# Patient Record
Sex: Female | Born: 1965 | Race: White | Hispanic: No | Marital: Married | Smoking: Former smoker
Health system: Southern US, Community
[De-identification: ages and names within clinical notes are randomized; demographics above are authoritative.]

## PROBLEM LIST (undated history)

## (undated) DIAGNOSIS — R609 Edema, unspecified: Secondary | ICD-10-CM

## (undated) DIAGNOSIS — K8020 Calculus of gallbladder without cholecystitis without obstruction: Secondary | ICD-10-CM

## (undated) DIAGNOSIS — K754 Autoimmune hepatitis: Secondary | ICD-10-CM

## (undated) DIAGNOSIS — R141 Gas pain: Secondary | ICD-10-CM

## (undated) DIAGNOSIS — I059 Rheumatic mitral valve disease, unspecified: Secondary | ICD-10-CM

## (undated) DIAGNOSIS — J019 Acute sinusitis, unspecified: Secondary | ICD-10-CM

## (undated) DIAGNOSIS — R945 Abnormal results of liver function studies: Secondary | ICD-10-CM

## (undated) DIAGNOSIS — E069 Thyroiditis, unspecified: Secondary | ICD-10-CM

## (undated) DIAGNOSIS — I10 Essential (primary) hypertension: Secondary | ICD-10-CM

## (undated) DIAGNOSIS — R142 Eructation: Secondary | ICD-10-CM

## (undated) DIAGNOSIS — E039 Hypothyroidism, unspecified: Secondary | ICD-10-CM

## (undated) DIAGNOSIS — K219 Gastro-esophageal reflux disease without esophagitis: Secondary | ICD-10-CM

## (undated) DIAGNOSIS — R143 Flatulence: Secondary | ICD-10-CM

## (undated) DIAGNOSIS — F419 Anxiety disorder, unspecified: Secondary | ICD-10-CM

## (undated) DIAGNOSIS — I080 Rheumatic disorders of both mitral and aortic valves: Secondary | ICD-10-CM

## (undated) HISTORY — DX: Flatulence: R14.3

## (undated) HISTORY — PX: ABDOMINAL HYSTERECTOMY: SHX81

## (undated) HISTORY — DX: Rheumatic disorders of both mitral and aortic valves: I08.0

## (undated) HISTORY — DX: Gastro-esophageal reflux disease without esophagitis: K21.9

## (undated) HISTORY — DX: Anxiety disorder, unspecified: F41.9

## (undated) HISTORY — DX: Eructation: R14.2

## (undated) HISTORY — PX: CHOLECYSTECTOMY: SHX55

## (undated) HISTORY — PX: MITRAL VALVE SURGERY: SHX714

## (undated) HISTORY — DX: Abnormal results of liver function studies: R94.5

## (undated) HISTORY — DX: Edema, unspecified: R60.9

## (undated) HISTORY — DX: Autoimmune hepatitis: K75.4

## (undated) HISTORY — DX: Thyroiditis, unspecified: E06.9

## (undated) HISTORY — DX: Calculus of gallbladder without cholecystitis without obstruction: K80.20

## (undated) HISTORY — DX: Essential (primary) hypertension: I10

## (undated) HISTORY — DX: Gas pain: R14.1

## (undated) HISTORY — DX: Acute sinusitis, unspecified: J01.90

## (undated) HISTORY — DX: Hypothyroidism, unspecified: E03.9

## (undated) HISTORY — DX: Rheumatic mitral valve disease, unspecified: I05.9

---

## 1999-01-03 ENCOUNTER — Emergency Department (HOSPITAL_COMMUNITY): Admission: EM | Admit: 1999-01-03 | Discharge: 1999-01-03 | Payer: Self-pay | Admitting: Emergency Medicine

## 2008-04-19 ENCOUNTER — Encounter: Payer: Self-pay | Admitting: Gastroenterology

## 2008-04-20 ENCOUNTER — Encounter: Payer: Self-pay | Admitting: Gastroenterology

## 2008-04-28 ENCOUNTER — Encounter: Payer: Self-pay | Admitting: Gastroenterology

## 2008-05-02 ENCOUNTER — Encounter (INDEPENDENT_AMBULATORY_CARE_PROVIDER_SITE_OTHER): Payer: Self-pay | Admitting: *Deleted

## 2008-05-02 ENCOUNTER — Ambulatory Visit: Payer: Self-pay | Admitting: Cardiovascular Disease

## 2008-05-03 DIAGNOSIS — I059 Rheumatic mitral valve disease, unspecified: Secondary | ICD-10-CM

## 2008-05-03 DIAGNOSIS — K754 Autoimmune hepatitis: Secondary | ICD-10-CM

## 2008-05-03 DIAGNOSIS — E559 Vitamin D deficiency, unspecified: Secondary | ICD-10-CM | POA: Insufficient documentation

## 2008-05-03 DIAGNOSIS — I10 Essential (primary) hypertension: Secondary | ICD-10-CM | POA: Insufficient documentation

## 2008-05-05 ENCOUNTER — Ambulatory Visit: Payer: Self-pay | Admitting: Gastroenterology

## 2008-05-05 DIAGNOSIS — R945 Abnormal results of liver function studies: Secondary | ICD-10-CM | POA: Insufficient documentation

## 2008-05-05 LAB — CONVERTED CEMR LAB
ALT: 26 units/L (ref 0–35)
Albumin: 3.6 g/dL (ref 3.5–5.2)
Basophils Absolute: 0 10*3/uL (ref 0.0–0.1)
CO2: 31 meq/L (ref 19–32)
Calcium: 9.4 mg/dL (ref 8.4–10.5)
Chloride: 105 meq/L (ref 96–112)
GFR calc Af Amer: 102 mL/min
GFR calc non Af Amer: 84 mL/min
Glucose, Bld: 85 mg/dL (ref 70–99)
Hemoglobin: 12.2 g/dL (ref 12.0–15.0)
INR: 1.1 — ABNORMAL HIGH (ref 0.8–1.0)
Lymphocytes Relative: 20.6 % (ref 12.0–46.0)
MCHC: 35.4 g/dL (ref 30.0–36.0)
Monocytes Relative: 13.3 % — ABNORMAL HIGH (ref 3.0–12.0)
Neutro Abs: 5.4 10*3/uL (ref 1.4–7.7)
Neutrophils Relative %: 62.2 % (ref 43.0–77.0)
Platelets: 329 10*3/uL (ref 150–400)
Potassium: 3.7 meq/L (ref 3.5–5.1)
Prothrombin Time: 12.1 s (ref 10.9–13.3)
RDW: 11.5 % (ref 11.5–14.6)
Sodium: 141 meq/L (ref 135–145)
Total Protein: 6.5 g/dL (ref 6.0–8.3)

## 2008-05-19 ENCOUNTER — Ambulatory Visit: Payer: Self-pay | Admitting: Cardiovascular Disease

## 2008-05-19 ENCOUNTER — Encounter: Payer: Self-pay | Admitting: Cardiovascular Disease

## 2008-05-19 LAB — CONVERTED CEMR LAB
ALT: 45 units/L — ABNORMAL HIGH (ref 0–35)
Albumin: 3.7 g/dL (ref 3.5–5.2)
BUN: 11 mg/dL (ref 6–23)
Bilirubin, Direct: 0.1 mg/dL (ref 0.0–0.3)
CO2: 32 meq/L (ref 19–32)
Calcium: 9.1 mg/dL (ref 8.4–10.5)
Free T4: 0.6 ng/dL (ref 0.6–1.6)
GFR calc Af Amer: 102 mL/min
Glucose, Bld: 90 mg/dL (ref 70–99)
TSH: 5.74 microintl units/mL — ABNORMAL HIGH (ref 0.35–5.50)
Total Protein: 6.7 g/dL (ref 6.0–8.3)

## 2008-05-30 ENCOUNTER — Ambulatory Visit: Payer: Self-pay | Admitting: Gastroenterology

## 2008-06-13 ENCOUNTER — Encounter: Payer: Self-pay | Admitting: Endocrinology

## 2008-06-24 ENCOUNTER — Ambulatory Visit: Payer: Self-pay | Admitting: Internal Medicine

## 2008-06-24 DIAGNOSIS — R143 Flatulence: Secondary | ICD-10-CM

## 2008-06-24 DIAGNOSIS — R141 Gas pain: Secondary | ICD-10-CM

## 2008-06-24 DIAGNOSIS — R142 Eructation: Secondary | ICD-10-CM

## 2008-06-24 DIAGNOSIS — K219 Gastro-esophageal reflux disease without esophagitis: Secondary | ICD-10-CM

## 2008-08-17 ENCOUNTER — Ambulatory Visit: Payer: Self-pay | Admitting: Internal Medicine

## 2008-08-17 DIAGNOSIS — J019 Acute sinusitis, unspecified: Secondary | ICD-10-CM

## 2008-08-17 LAB — CONVERTED CEMR LAB
Basophils Absolute: 0.2 10*3/uL — ABNORMAL HIGH (ref 0.0–0.1)
Basophils Relative: 2.2 % (ref 0.0–3.0)
Eosinophils Absolute: 0.3 10*3/uL (ref 0.0–0.7)
Hemoglobin: 13.4 g/dL (ref 12.0–15.0)
Lymphocytes Relative: 19.3 % (ref 12.0–46.0)
MCHC: 34.6 g/dL (ref 30.0–36.0)
MCV: 84.3 fL (ref 78.0–100.0)
Monocytes Absolute: 0.4 10*3/uL (ref 0.1–1.0)
Neutro Abs: 5.4 10*3/uL (ref 1.4–7.7)
Neutrophils Relative %: 69.9 % (ref 43.0–77.0)
RBC: 4.57 M/uL (ref 3.87–5.11)
RDW: 12.8 % (ref 11.5–14.6)

## 2008-08-24 ENCOUNTER — Ambulatory Visit: Payer: Self-pay | Admitting: Endocrinology

## 2008-08-24 DIAGNOSIS — E039 Hypothyroidism, unspecified: Secondary | ICD-10-CM | POA: Insufficient documentation

## 2008-08-24 DIAGNOSIS — E069 Thyroiditis, unspecified: Secondary | ICD-10-CM | POA: Insufficient documentation

## 2008-10-10 ENCOUNTER — Ambulatory Visit: Payer: Self-pay | Admitting: Endocrinology

## 2008-10-11 ENCOUNTER — Encounter: Payer: Self-pay | Admitting: Endocrinology

## 2008-10-11 LAB — CONVERTED CEMR LAB: Vit D, 25-Hydroxy: 28 ng/mL — ABNORMAL LOW (ref 30–89)

## 2008-11-11 DIAGNOSIS — R609 Edema, unspecified: Secondary | ICD-10-CM

## 2008-11-11 DIAGNOSIS — I080 Rheumatic disorders of both mitral and aortic valves: Secondary | ICD-10-CM | POA: Insufficient documentation

## 2008-11-15 ENCOUNTER — Ambulatory Visit: Payer: Self-pay | Admitting: Cardiovascular Disease

## 2009-01-09 ENCOUNTER — Ambulatory Visit: Payer: Self-pay

## 2009-01-09 ENCOUNTER — Ambulatory Visit: Payer: Self-pay | Admitting: Cardiovascular Disease

## 2009-01-09 ENCOUNTER — Encounter: Payer: Self-pay | Admitting: Cardiovascular Disease

## 2009-01-10 ENCOUNTER — Ambulatory Visit: Payer: Self-pay | Admitting: Endocrinology

## 2009-01-11 LAB — CONVERTED CEMR LAB
Free T4: 1 ng/dL (ref 0.6–1.6)
T3, Free: 3.1 pg/mL (ref 2.3–4.2)
TSH: 1.86 microintl units/mL (ref 0.35–5.50)

## 2009-01-12 LAB — CONVERTED CEMR LAB: Vit D, 25-Hydroxy: 30 ng/mL (ref 30–89)

## 2009-02-28 ENCOUNTER — Ambulatory Visit: Payer: Self-pay | Admitting: Internal Medicine

## 2009-02-28 DIAGNOSIS — J189 Pneumonia, unspecified organism: Secondary | ICD-10-CM

## 2009-05-17 ENCOUNTER — Encounter (INDEPENDENT_AMBULATORY_CARE_PROVIDER_SITE_OTHER): Payer: Self-pay | Admitting: *Deleted

## 2009-05-31 ENCOUNTER — Ambulatory Visit: Payer: Self-pay | Admitting: Endocrinology

## 2009-05-31 DIAGNOSIS — E8941 Symptomatic postprocedural ovarian failure: Secondary | ICD-10-CM

## 2009-05-31 LAB — CONVERTED CEMR LAB: TSH: 2.25 microintl units/mL (ref 0.35–5.50)

## 2009-06-28 ENCOUNTER — Ambulatory Visit: Payer: Self-pay | Admitting: Cardiovascular Disease

## 2009-08-10 ENCOUNTER — Telehealth (INDEPENDENT_AMBULATORY_CARE_PROVIDER_SITE_OTHER): Payer: Self-pay | Admitting: *Deleted

## 2009-08-14 ENCOUNTER — Encounter: Payer: Self-pay | Admitting: Cardiovascular Disease

## 2009-08-14 ENCOUNTER — Ambulatory Visit: Payer: Self-pay

## 2009-08-14 ENCOUNTER — Encounter (HOSPITAL_COMMUNITY): Admission: RE | Admit: 2009-08-14 | Discharge: 2009-10-24 | Payer: Self-pay | Admitting: Cardiovascular Disease

## 2009-08-14 ENCOUNTER — Ambulatory Visit: Payer: Self-pay | Admitting: Cardiology

## 2009-08-14 ENCOUNTER — Ambulatory Visit (HOSPITAL_COMMUNITY): Admission: RE | Admit: 2009-08-14 | Discharge: 2009-08-14 | Payer: Self-pay | Admitting: Cardiovascular Disease

## 2010-01-23 ENCOUNTER — Ambulatory Visit: Payer: Self-pay | Admitting: Internal Medicine

## 2010-01-23 ENCOUNTER — Encounter: Payer: Self-pay | Admitting: Internal Medicine

## 2010-01-23 DIAGNOSIS — J45901 Unspecified asthma with (acute) exacerbation: Secondary | ICD-10-CM | POA: Insufficient documentation

## 2010-01-23 DIAGNOSIS — J3089 Other allergic rhinitis: Secondary | ICD-10-CM

## 2010-01-25 ENCOUNTER — Encounter: Payer: Self-pay | Admitting: Internal Medicine

## 2010-03-16 IMAGING — CR DG CHEST 2V
2 series · 2 of 2 positions shown · non-contrast
Comparison: None

CLINICAL DATA: Posterior right chest pain, cough, hypertension,
smoker, right lower lobe pleurisy

CHEST - 2 VIEW

[view not recorded (1 of 2)]
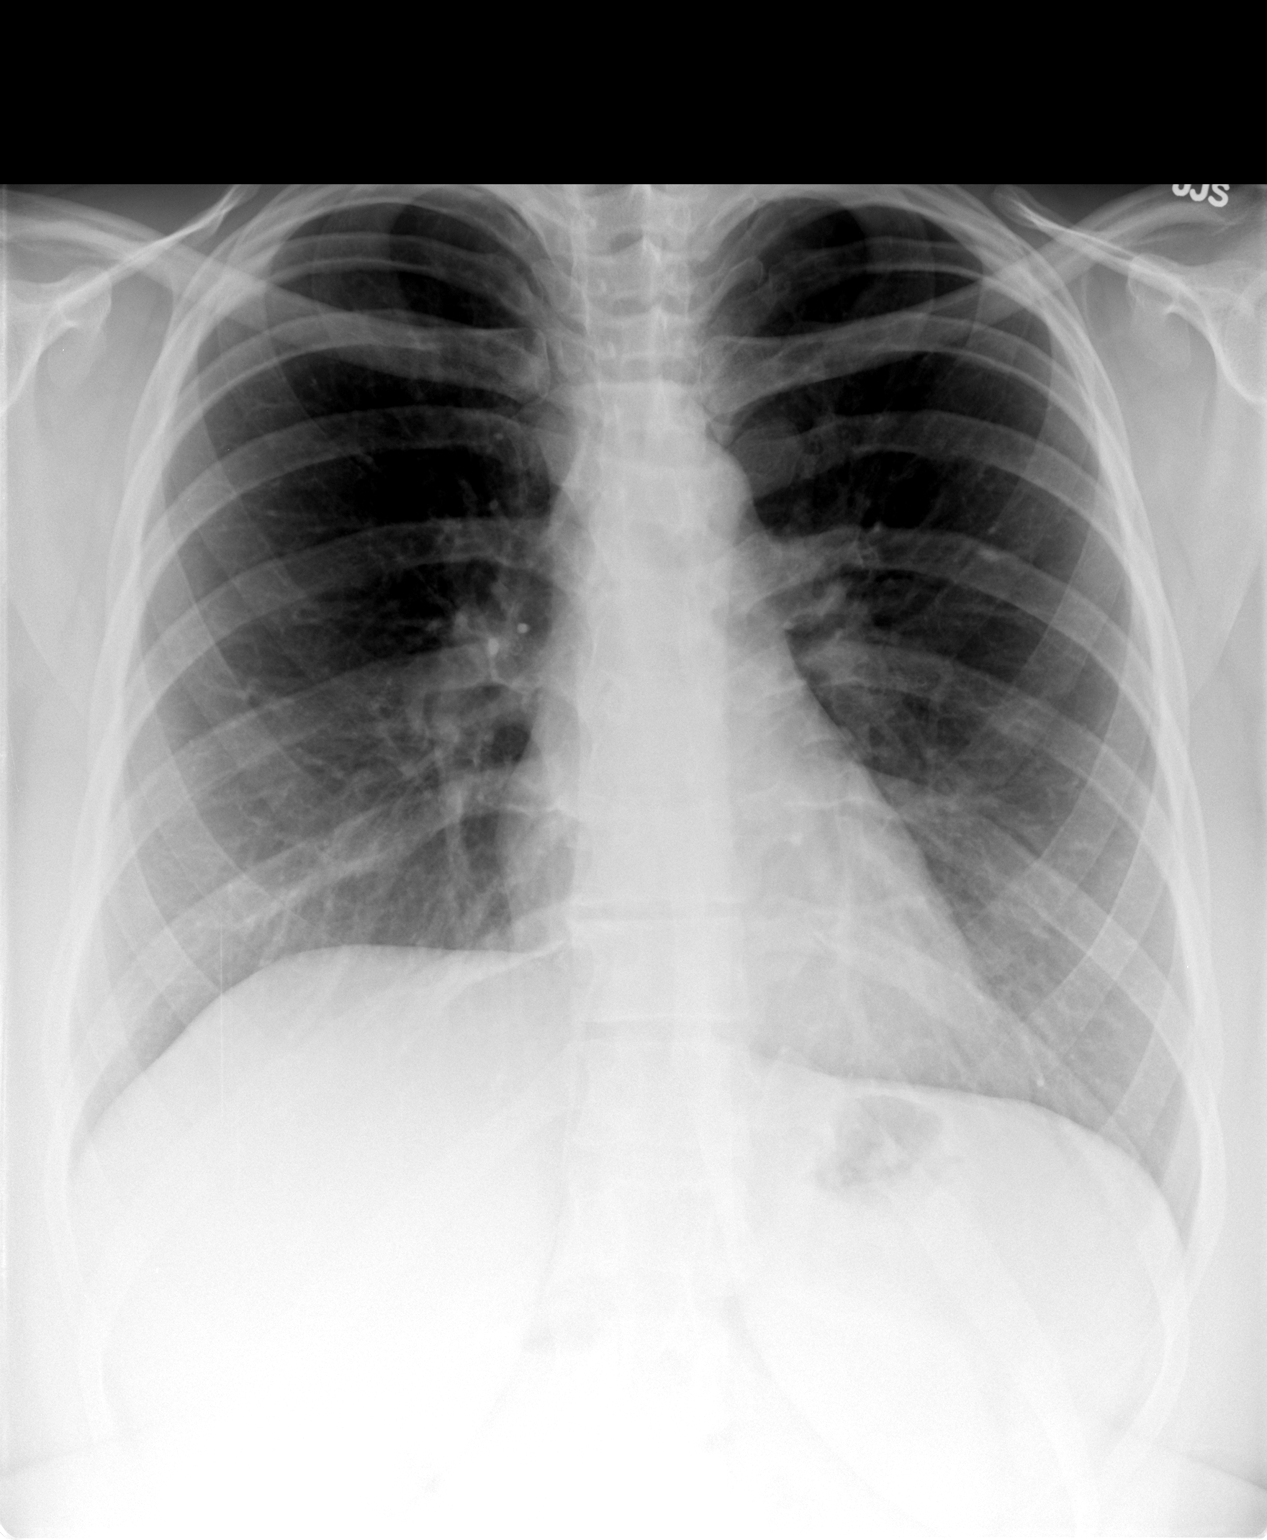

[view not recorded (2 of 2)]
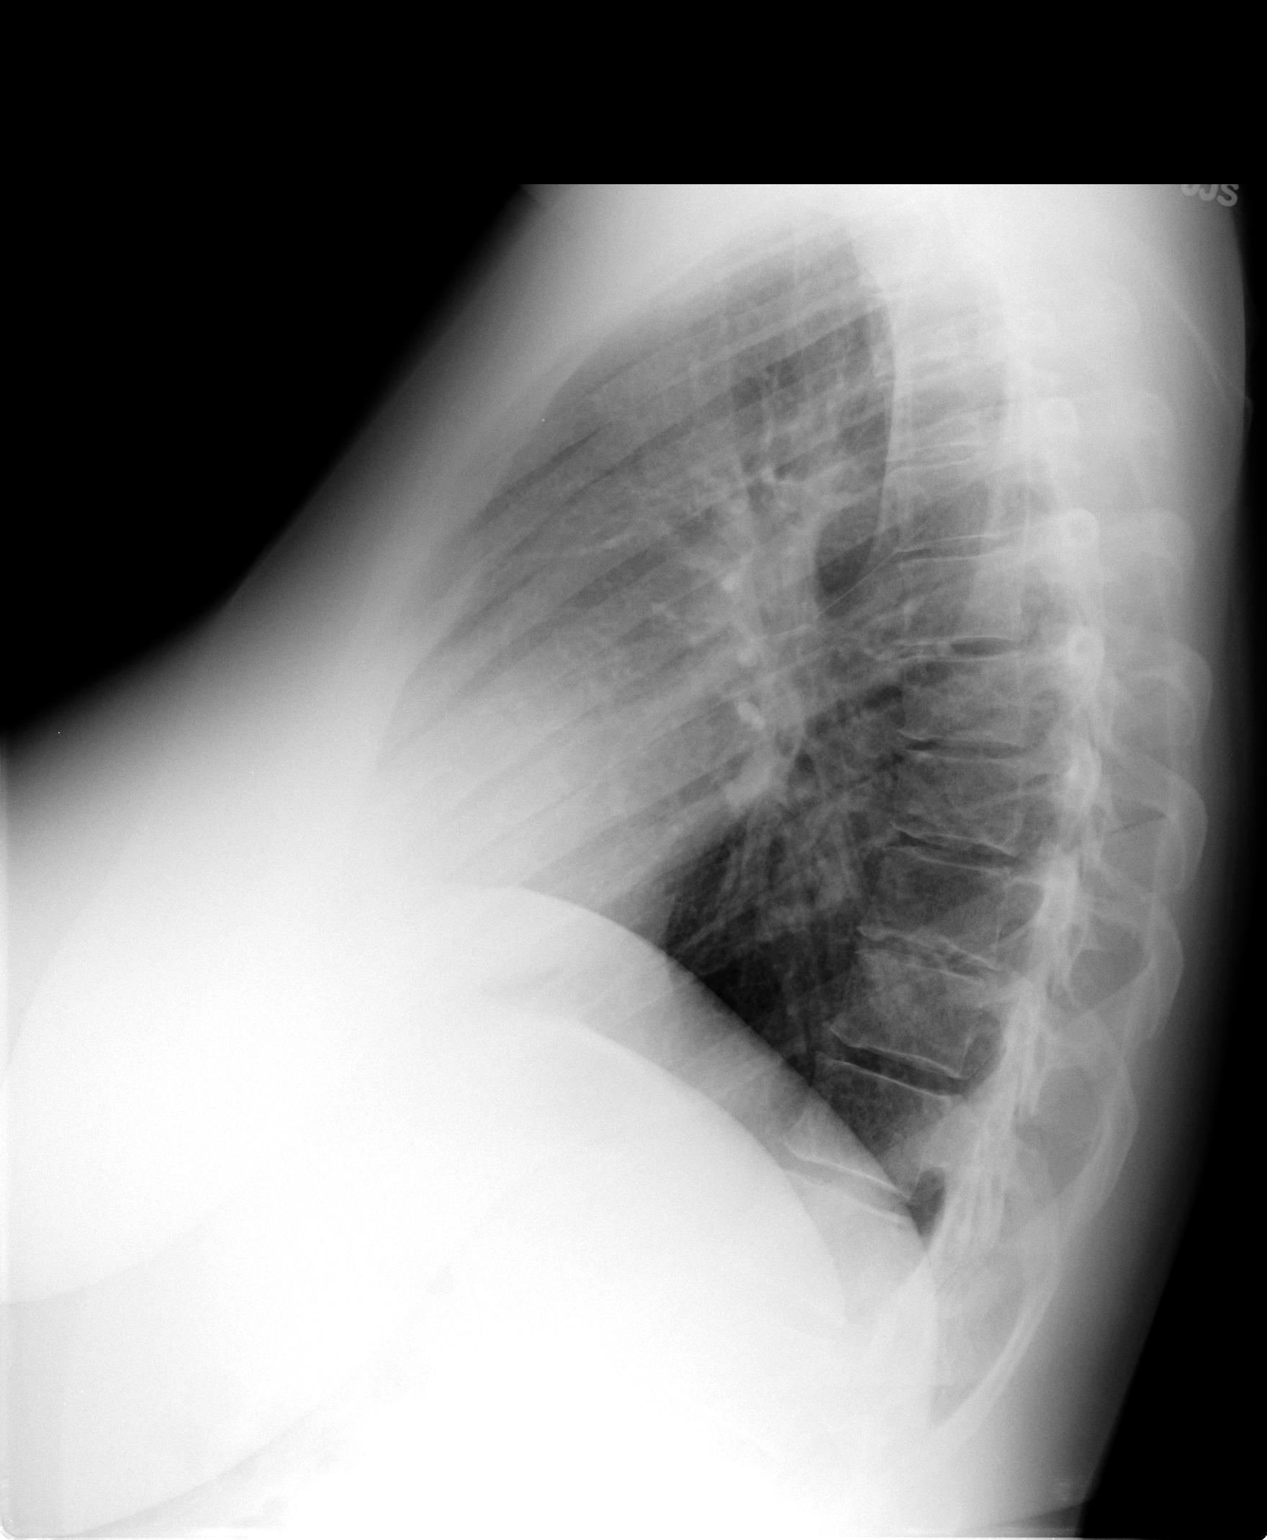

[2 of 2 positions shown; findings below may reference images not displayed]

FINDINGS: Normal heart size, mediastinal contours, and pulmonary vascularity.
Lungs clear.
Bones unremarkable.
No pneumothorax.
IMPRESSION: No acute abnormalities.

## 2010-04-09 ENCOUNTER — Ambulatory Visit: Payer: Self-pay | Admitting: Internal Medicine

## 2010-05-22 NOTE — Assessment & Plan Note (Signed)
Summary: F6M/DM   Referring Provider:  Eden Emms Primary Provider:  Etta Grandchild MD  CC:  pt compalins of chest pain which radiates to her left arm pt thinks its due to stress mtoher passed away.  History of Present Illness: Marilyn Dougherty is seen today for F/U of significant MR and posterior leaflet prolpase.  I reviewed her echo from today.  She had restricted motion of the posteriror leaflet but the prolapse was difficult to visualize.  I thought her MR was more moderate in nature. with normal LV cavity size and funciton.  She is asymptomatic with no dyspnea, PND, orthopnea edema or palpitaitons.  She had some SSCP last week while at [facility].  Her mother in law was dying from pneumonia.  The pain was left center inthe chest "squeezing" with intermitant nature.  She had recurrences for 2-3 days but is improved now.  She has no documented CAD but has not had a recent ETT.  He ECG has non-specific lateeral T wave changes and I think she will need a myovue.  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 1/2 Tablet Two Times A Day 2)  Levothyroxine Sodium 25 Mcg Tabs (Levothyroxine Sodium) .Marland Kitchen.. 1 Qd  Allergies (verified): 1)  ! * Z Pack  Past History:  Past Medical History: Last updated: 11/11/2008 Current Problems:    HYPERTENSION (ICD-401.9) MITRAL VALVE PROLAPSE (ICD-424.0) MITRAL INSUFFICIENCY (ICD-396.3) THYROIDITIS (ICD-245.9) SINUSITIS- ACUTE-NOS (ICD-461.9) GERD (ICD-530.81) GAS/BLOATING (ICD-787.3) NONSPECIFIC ABNORMAL RESULTS LIVR FUNCTION STUDY (ICD-794.8) UNSPECIFIED VITAMIN D DEFICIENCY (ICD-268.9) AUTOIMMUNE HEPATITIS (ICD-571.42) EDEMA (ICD-782.3) HYPOTHYROIDISM (ICD-244.9) Anxiety Disorder Gallstones  Past Surgical History: Last updated: 11/11/2008 Cholecystectomy Hysterectomy  mitral valve surgery in [place].  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  Alcohol Use - no Daily Caffeine Use Illicit Drug Use - no Patient does not get regular exercise.   Review of Systems       Denies fever, malais, weight loss, blurry vision, decreased visual acuity, cough, sputum, SOB, hemoptysis, pleuritic pain, palpitaitons, heartburn, abdominal pain, melena, lower extremity edema, claudication, or rash.   Vital Signs:  Patient profile:   45 year old female Height:      67 inches Weight:      229 pounds BMI:     36.00 Pulse rate:   69 / minute Resp:     12 per minute BP sitting:   119 / 69  (left arm)  Vitals Entered By: Kem Parkinson (June 28, 2009 10:18 AM)  Physical Exam  General:  Affect appropriate Healthy:  appears  stated age HEENT: normal Neck supple with no adenopathy JVP normal no bruits no thyromegaly Lungs clear with no wheezing and good diaphragmatic motion Heart:  S1/S2 no murmur,rub, gallop or click PMI normal Abdomen: benighn, BS positve, no tenderness, no AAA no bruit.  No  HSM or HJR Distal pulses intact with no bruits No edema Neuro non-focal Skin warm and dry    Impression & Recommendations:  Problem # 1:  CHEST PAIN (ICD-786.50) Continue ASA and BB  Myouve Her updated medication list for this problem includes:    Atenolol 25 Mg Tabs (Atenolol) .Marland Kitchen... 1/2 tablet two times a day  Orders: Nuclear Stress Test (Nuc Stress Test)  Problem # 2:  HYPERTENSION (ICD-401.9) Well cotnrolled Her updated medication list for this problem includes:    Atenolol 25 Mg Tabs (Atenolol) .Marland Kitchen... 1/2 tablet two times a day  Problem # 3:  MITRAL VALVE PROLAPSE (ICD-424.0) Wtih moderate MR  Echo to reassess severity and check ESV and EF Her updated medication list for this problem includes:    Atenolol 25 Mg Tabs (Atenolol) .Marland Kitchen... 1/2 tablet two times a day  Problem # 4:  THYROIDITIS (ICD-245.9) Normla TSH/  F/U Dr Everardo All  Other Orders: Echocardiogram (Echo)  Patient Instructions: 1)  Your physician recommends that you schedule a follow-up appointment in: 6 MONTHS 2)  Your physician has requested that you have an echocardiogram.  Echocardiography is a painless test that uses sound waves to create images of your heart. It provides your doctor with information about the size and shape of your heart and how well your heart's chambers and valves are working.  This procedure takes approximately one hour. There are no restrictions for this procedure. 3)  Your physician has requested that you have an exercise stress myoview.  For further information please visit [email or web address]  Please follow instruction sheet, as given.   EKG Report  Procedure date:  06/28/2009  Findings:      NSR 70 Nonspecifc lateral T wave changes LAE Borderline ECG

## 2010-05-22 NOTE — Assessment & Plan Note (Signed)
Summary: f/u appt/#/cd   Vital Signs:  Patient profile:   45 year old female Height:      67 inches (170.18 cm) Weight:      227 pounds (103.18 kg) O2 Sat:      99 % on Room air Temp:     96.8 degrees F (36 degrees C) oral Pulse rate:   66 / minute BP sitting:   110 / 72  (left arm) Cuff size:   large  Vitals Entered By: Josph Macho CMA (May 31, 2009 10:49 AM)  O2 Flow:  Room air CC: follow-up visit/ pt states she is no longer taking Calcium/ CF   Referring Provider:  Eden Emms Primary Provider:  Etta Grandchild MD  CC:  follow-up visit/ pt states she is no longer taking Calcium/ CF.  History of Present Illness: pt has h/o subacute thyroiditis. during the hypothyroid phase, she was started on synthroid 25 micrograms/day, and she is still on that now.  she forgets synthroid approx 1/week.  pt states she feels well in general, except for "hot flashes."  she says she does not have heat intolerance in general.  she had tah 2 years ago, but she did not have bso.    Current Medications (verified): 1)  Atenolol 25 Mg Tabs (Atenolol) .... 1/2 Tablet Two Times A Day 2)  Calcium 500 Mg Tabs (Calcium Carbonate) .Marland Kitchen.. 1 Tab By Mouth Once Daily 3)  Levothyroxine Sodium 25 Mcg Tabs (Levothyroxine Sodium) .Marland Kitchen.. 1 Qd  Allergies (verified): 1)  ! * Z Pack  Past History:  Past Medical History: Last updated: 11/11/2008 Current Problems:  HYPERTENSION (ICD-401.9) MITRAL VALVE PROLAPSE (ICD-424.0) MITRAL INSUFFICIENCY (ICD-396.3) THYROIDITIS (ICD-245.9) SINUSITIS- ACUTE-NOS (ICD-461.9) GERD (ICD-530.81) GAS/BLOATING (ICD-787.3) NONSPECIFIC ABNORMAL RESULTS LIVR FUNCTION STUDY (ICD-794.8) UNSPECIFIED VITAMIN D DEFICIENCY (ICD-268.9) AUTOIMMUNE HEPATITIS (ICD-571.42) EDEMA (ICD-782.3) HYPOTHYROIDISM (ICD-244.9) Anxiety Disorder Gallstones  Review of Systems  The patient denies weight loss and weight gain.    Physical Exam  General:  obese.  no distress  Neck:  thyroid is  not enlarged Additional Exam:  FastTSH                   2.25 uIU/mL                 0.35-5.50 FSH                       5.8 mIU/ML Leutinizing Hormone       4.03 mIU/mL   Impression & Recommendations:  Problem # 1:  THYROIDITIS (ICD-245.9) euthyroid on a modest replacement dosage  Problem # 2:  "hot flashes" these gonadotropins do not indicate menopause  Other Orders: TLB-TSH (Thyroid Stimulating Hormone) (84443-TSH) TLB-FSH (Follicle Stimulating Hormone) (83001-FSH) TLB-Luteinizing Hormone (LH) (83002-LH) Est. Patient Level III (24401)  Patient Instructions: 1)  pending the test results, please continue the same medications for now 2)  return here as needed.  at least each year, you should have a physical examination of the thyroid, and a thyroid blood test.  3)  tests are being ordered for you today.  a few days after the test(s), please call [phone] to hear your test results. 4)  (update: i left message on phone-tree:  rx as we discussed)

## 2010-05-22 NOTE — Letter (Signed)
Summary: Appointment - Reminder 2  [facility], Main Office  [address]   [place], [place] [address]   Phone: [phone]  Fax: [phone]     May 17, 2009 MRN: [id number]   [facility] [address] [place], [place]  [address]   Dear Ms. Marilyn Dougherty,  Our records indicate that it is time to schedule a follow-up appointment with Dr. Eden Emms. It is very important that we reach you to schedule this appointment. We look forward to participating in your health care needs. Please contact us at the number listed above at your earliest convenience to schedule your appointment.  If you are unable to make an appointment at this time, give [place] a call so we can update our records.  Sincerely,   Migdalia Dk [facility] Scheduling Team

## 2010-05-22 NOTE — Progress Notes (Signed)
Summary: Nuclear Pre-Procedure  Phone Note Outgoing Call   Call placed by: Milana Na, EMT-P,  August 10, 2009 11:02 AM Summary of Call: Reviewed information on Myoview Information Sheet (see scanned document for further details).  Spoke with patient.     Nuclear Med Background Indications for Stress Test: Evaluation for Ischemia   History: Echo  History Comments: 09/10 ECHO EF 55-65% Mod. MR  Symptoms: Chest Pain    Nuclear Pre-Procedure Cardiac Risk Factors: Family History - CAD, History of Smoking, Hypertension Height (in): 67  Nuclear Med Study Referring MD:  P.Sara Lee

## 2010-05-22 NOTE — Assessment & Plan Note (Signed)
Summary: Cardiology Nuclear Study  Nuclear Med Background Indications for Stress Test: Evaluation for Ischemia   History: Echo  History Comments: 09/10 ECHO EF 55-65% Mod. MR  Symptoms: Chest Pain, DOE, Fatigue, Palpitations    Nuclear Pre-Procedure Cardiac Risk Factors: Family History - CAD, History of Smoking, Hypertension, Smoker Caffeine/Decaff Intake: None NPO After: 8:00 PM Lungs: clear IV 0.9% NS with Angio Cath: 22g     IV Site: (R) Hand IV Started by: Irean Hong RN Chest Size (in) 42     Cup Size DDD     Height (in): 68 Weight (lb): 225 BMI: 34.33 Tech Comments: Atenolol 8 am today per MD orders.  Nuclear Med Study 1 or 2 day study:  1 day     Stress Test Type:  Stress Reading MD:  Olga Millers, MD     Referring MD:  P.Nishan Resting Radionuclide:  Technetium 75m Tetrofosmin     Resting Radionuclide Dose:  11.0 mCi  Stress Radionuclide:  Technetium 68m Tetrofosmin     Stress Radionuclide Dose:  33.0 mCi   Stress Protocol Exercise Time (min):  10:00 min     Max HR:  160 bpm     Predicted Max HR:  179 bpm  Max Systolic BP: 161 mm Hg     Percent Max HR:  89.39 %     METS: 10.50 Rate Pressure Product:  82956    Stress Test Technologist:  Frederick Peers EMT-P     Nuclear Technologist:  Domenic Polite CNMT  Rest Procedure  Myocardial perfusion imaging was performed at rest 45 minutes following the intravenous administration of Myoview Technetium 61m Tetrofosmin.  Stress Procedure  The patient exercised for 10:00 mins . The patient stopped due to leg fatigue/SOB  and denied any chest pain.  There were non specific ST-T wave changes.  Myoview was injected at peak exercise and myocardial perfusion imaging was performed after a brief delay.  QPS Raw Data Images:  There is a breast shadow that accounts for the anterior attenuation. Stress Images:  There is decreased uptake in the distal anterior wall and apex. Rest Images:  There is decreased uptake in the distal  anterior wall and apex, slightly less prominent compared to the stress images. Subtraction (SDS):  There is a fixed anterior defect that is most consistent with breast attenuation. Transient Ischemic Dilatation:  1.13  (Normal <1.22)  Lung/Heart Ratio:  .33  (Normal <0.45)  Quantitative Gated Spect Images QGS EDV:  97 ml QGS ESV:  26 ml QGS EF:  73 % QGS cine images:  Normal wall motion.   Overall Impression  Exercise Capacity: Good exercise capacity. BP Response: Normal blood pressure response. Clinical Symptoms: There is dyspnea. ECG Impression: No significant ST segment change suggestive of ischemia. Overall Impression: There is breast attenuation but no sign of scar or ischemia.  Appended Document: Cardiology Nuclear Study normal nuclear study  Appended Document: Cardiology Nuclear Study pt aware of results

## 2010-05-22 NOTE — Letter (Signed)
Summary: Out of Work  [facility]  [address] [place], [place] [address]   Phone: [phone]  Fax: [phone]    January 23, 2010   Employee:  Marilyn Dougherty    To Whom It May Concern:   For Medical reasons, please excuse the above named employee from work for the following dates:  Start:   01/23/2010  End:   01/25/2010  If you need additional information, please feel free to contact our office.         Sincerely,    Etta Grandchild MD

## 2010-05-22 NOTE — Letter (Signed)
Summary: Out of Work  [facility]  [address] [place], [place] [address]   Phone: [phone]  Fax: [phone]    January 25, 2010   Employee:  ADALEE KATHAN    To Whom It May Concern:   For Medical reasons, please excuse the above named employee from work for the following dates:  Start:   01/25/2010  End:   01/29/2010  If you need additional information, please feel free to contact our office.         Sincerely,    Alvy Beal A CMA for Dr. Sanda Linger  Appended Document: Out of Work Patient walked in 01/25/10 stating that she returned to work this am, only to be sent back home due to continued illness. Per MD, ok to provide out of work note until 01/29/10. Patient given note and told that we will call her once FMLA forms have been completed. [place]/

## 2010-05-22 NOTE — Assessment & Plan Note (Signed)
Summary: ?SEVERE SINUS INFECTION-LB   Vital Signs:  Patient profile:   45 year old female Menstrual status:  hysterectomy Height:      68 inches Weight:      230 pounds O2 Sat:      98 % on Room air Temp:     98.1 degrees F oral Pulse rate:   80 / minute Pulse rhythm:   regular Resp:     16 per minute BP sitting:   134 / 80  (left arm)  O2 Flow:  Room air  Primary Care Provider:  Etta Grandchild MD  CC:  URI symptoms.  History of Present Illness:  URI Symptoms      This is a 45 year old woman who presents with URI symptoms.  The symptoms began 2 weeks ago.  The severity is described as mild.  The patient reports nasal congestion, sore throat, productive cough, and sick contacts, but denies clear nasal discharge, purulent nasal discharge, and earache.  Associated symptoms include wheezing.  The patient denies fever, fever of 103.1-104 degrees, stiff neck, dyspnea, rash, vomiting, diarrhea, use of an antipyretic, and response to antipyretic.  The patient also reports itchy throat, sneezing, and seasonal symptoms.  The patient denies headache, muscle aches, and severe fatigue.  Risk factors for Strep sinusitis include unilateral facial pain, unilateral nasal discharge, poor response to decongestant, and double sickening.  The patient denies the following risk factors for Strep sinusitis: tooth pain, Strep exposure, tender adenopathy, and absence of cough.    Allergies: 1)  ! * Z Pack  Past History:  Past Medical History: Last updated: 11/11/2008 Current Problems:  HYPERTENSION (ICD-401.9) MITRAL VALVE PROLAPSE (ICD-424.0) MITRAL INSUFFICIENCY (ICD-396.3) THYROIDITIS (ICD-245.9) SINUSITIS- ACUTE-NOS (ICD-461.9) GERD (ICD-530.81) GAS/BLOATING (ICD-787.3) NONSPECIFIC ABNORMAL RESULTS LIVR FUNCTION STUDY (ICD-794.8) UNSPECIFIED VITAMIN D DEFICIENCY (ICD-268.9) AUTOIMMUNE HEPATITIS (ICD-571.42) EDEMA (ICD-782.3) HYPOTHYROIDISM (ICD-244.9) Anxiety Disorder Gallstones  Past  Surgical History: Last updated: 11/11/2008 Cholecystectomy Hysterectomy  mitral valve surgery in [place].  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  Alcohol Use - no Daily Caffeine Use Illicit Drug Use - no Patient does not get regular exercise.   Risk Factors: Exercise: no (05/05/2008)  Risk Factors: Smoking Status: quit (05/05/2008)  Family History: Reviewed history from 08/24/2008 and no changes required. Family History of Colitis/Crohn's: Paternal Cousin x 3 Family History of Heart Disease: Paternal Grandmother Family History of Liver Disease/Cirrhosis:Mother Family History Lung Cancer: Father (smoker) mother (deceased) and a sister have uncertain type of thyroid dz  Social History: Reviewed history from 05/05/2008 and no changes required. 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  Alcohol Use - no Daily Caffeine Use Illicit Drug Use - no Patient does not get regular exercise.   Review of Systems  The patient denies anorexia, fever, decreased hearing, hoarseness, chest pain, peripheral edema, headaches, hemoptysis, abdominal pain, suspicious skin lesions, and enlarged lymph nodes.    Physical Exam  General:  overweight-appearing.  alert, well-developed, well-nourished, and cooperative to examination.    Ears:  R ear normal and L ear normal.   Nose:  no external deformity, no airflow obstruction, no intranasal foreign body, no nasal polyps, no nasal mucosal  lesions, no mucosal friability, no active bleeding or clots, no sinus percussion tenderness, no septum  abnormalities, nasal dischargemucosal pallor, and mucosal edema.   Mouth:  Oral mucosa and oropharynx without lesions or exudates.  Teeth in good repair. Neck:  supple, full ROM, no masses, no thyromegaly, no JVD, normal carotid upstroke, no carotid bruits, no cervical lymphadenopathy, and no neck tenderness.   Lungs:  normal respiratory effort, no intercostal retractions, no accessory muscle use, normal breath sounds, no dullness, no fremitus, no crackles, R  faint inspri wheezes, and L faint inspir wheezes.  she has good air movement. Heart:  normal rate, regular rhythm, no murmur, no gallop, no rub, and no JVD.   Abdomen:  soft, non-tender, normal bowel sounds, no distention, no masses, no guarding, no rigidity, no rebound tenderness, no abdominal hernia, no inguinal hernia, no hepatomegaly, and no splenomegaly.   Msk:  No deformity or scoliosis noted of thoracic or lumbar spine.   Pulses:  R and L carotid,radial,femoral,dorsalis pedis and posterior tibial pulses are full and equal bilaterally Extremities:  No clubbing, cyanosis, edema, or deformity noted with normal full range of motion of all joints.   Neurologic:  No cranial nerve deficits noted. Station and gait are normal. Plantar reflexes are down-going bilaterally. DTRs are symmetrical throughout. Sensory, motor and coordinative functions appear intact. Skin:  Intact without suspicious lesions or rashes Cervical Nodes:  No lymphadenopathy noted Axillary Nodes:  No palpable lymphadenopathy Psych:  Cognition and judgment appear intact. Alert and cooperative with normal attention span and concentration. No apparent delusions, illusions, hallucinations   Impression & Recommendations:  Problem # 1:  ASTHMA NOS W/ACUTE EXACERBATION (ICD-493.92) will give depo-medrol IM Her updated medication list for this problem includes:    Dulera 100-5 Mcg/act Aero (Mometasone furo-formoterol fum) .Marland Kitchen... 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 One by mouth two times a day for 10 days    Mytussin Ac 100-10 Mg/46ml Syrp (Guaifenesin-codeine) .Marland Kitchen... 5-10 ml by mouth qid as needed for cough  Problem # 5:  HYPERTENSION (ICD-401.9) Assessment: Improved  Her updated medication list for this problem includes:    Atenolol 25 Mg Tabs (Atenolol) .Marland Kitchen... 1/2 tablet two times a day  BP today: 134/80 Prior BP: 119/69 (06/28/2009)  Prior 10 Yr Risk Heart Disease: Not enough information (06/24/2008)  Labs Reviewed: K+: 3.9 (05/19/2008) Creat: : 0.8 (05/19/2008)     Complete Medication List: 1)  Atenolol 25 Mg Tabs (Atenolol) .... 1/2 tablet two times a day 2)  Levothyroxine Sodium 25 Mcg Tabs (Levothyroxine sodium) .Marland Kitchen.. 1 qd 3)  Dulera 100-5 Mcg/act Aero (Mometasone furo-formoterol fum) .... 2 puffs two times a day for asthma 4)  Cefuroxime Axetil 500 Mg Tabs (Cefuroxime axetil) .... One by mouth two times a day for 10 days 5)  Mytussin Ac 100-10 Mg/63ml Syrp (Guaifenesin-codeine) .... 5-10 ml by mouth qid as needed for cough  Patient Instructions: 1)  Please schedule a follow-up appointment in 2 weeks. 2)  It is important that you exercise  regularly at least 20 minutes 5 times a week. If you develop chest pain, have severe difficulty breathing, or feel very tired , stop exercising immediately and seek medical attention. 3)  You need to lose weight. Consider a lower calorie diet and regular exercise.  4)  It is important to use your inhaler properly. Use a  spacer, take slow deep breaths and hold them. Rinse your mouth after using.  5)  Take your antibiotic as prescribed until ALL of it is gone, but stop if you develop a rash or swelling and contact our office as soon as possible. 6)  Acute sinusitis symptoms for less than 10 days are not helped by antibiotics.Use warm moist compresses, and over the counter decongestants ( only as directed). Call if no improvement in 5-7 days, sooner if increasing pain, fever, or new symptoms. Prescriptions: MYTUSSIN AC 100-10 MG/5ML SYRP (GUAIFENESIN-CODEINE) 5-10 ml by mouth QID as needed for cough  #8 ounces x 0   Entered and Authorized by:   Etta Grandchild MD   Signed by:   Etta Grandchild MD on 01/23/2010   Method used:   Print then Give to Patient   RxID:   [id number] CEFUROXIME AXETIL 500 MG TABS (CEFUROXIME AXETIL) One by mouth two times a day for 10 days  #20 x 1   Entered and Authorized by:   Etta Grandchild MD   Signed by:   Etta Grandchild MD on 01/23/2010   Method used:   Print then Give to Patient   RxID:   [id number] DULERA 100-5 MCG/ACT AERO (MOMETASONE FURO-FORMOTEROL FUM) 2 puffs two times a day for asthma  #4 inhs x 0   Entered and Authorized by:   Etta Grandchild MD   Signed by:   Etta Grandchild MD on 01/23/2010   Method used:   Samples Given   RxID:   [id number]    Medication Administration  Injection # 1:    Medication: Depo- Medrol 80mg     Diagnosis: ALLERGIC RHINITIS DUE TO OTHER ALLERGEN (ICD-477.8)    Route: IV    Site: R deltoid    Exp Date: 07/2012    Lot #: [id number]    Mfr: pfizer    Patient tolerated injection without complications    Given by: Rock Nephew CMA (January 23, 2010 9:34 AM)  Injection # 2:    Medication: Depo- Medrol 40mg     Diagnosis: ALLERGIC RHINITIS DUE TO OTHER ALLERGEN (ICD-477.8)    Route: IM    Site: R deltoid    Exp Date: 07/2012    Lot #: [id number]    Mfr: pfizer    Patient tolerated injection without complications    Given  by: Rock Nephew CMA (January 23, 2010 9:35 AM)  Orders Added: 1)  T-2 View CXR [71020TC] 2)  Admin of Therapeutic Inj  intramuscular or subcutaneous [96372] 3)  Depo- Medrol 40mg  [J1030] 4)  Depo- Medrol 80mg  [J1040] 5)  Est. Patient Level V [29518]

## 2010-05-24 NOTE — Assessment & Plan Note (Signed)
Summary: headache/fever/sore throat/will come to side door/cd   Vital Signs:  Patient profile:   45 year old female Menstrual status:  hysterectomy Height:      68 inches Weight:      225.25 pounds BMI:     34.37 O2 Sat:      97 % on Room air Temp:     97.9 degrees F oral Pulse rate:   91 / minute Pulse rhythm:   regular Resp:     16 per minute BP sitting:   106 / 64  (left arm) Cuff size:   large  Vitals Entered By: Rock Nephew CMA (April 09, 2010 10:30 AM)  Nutrition Counseling: Patient's BMI is greater than 25 and therefore counseled on weight management options.  O2 Flow:  Room air CC: Patient c/o fever, cough congestion , URI symptoms Is Patient Diabetic? No Pain Assessment Patient in pain? no        Primary Care Provider:  Etta Grandchild MD  CC:  Patient c/o fever, cough congestion , and URI symptoms.  History of Present Illness:  URI Symptoms      This is a 45 year old woman who presents with URI symptoms.  The symptoms began 3 days ago.  The severity is described as mild.  The patient reports nasal congestion, purulent nasal discharge, sore throat, and productive cough, but denies dry cough, earache, and sick contacts.  Associated symptoms include low-grade fever (<100.5 degrees).  The patient denies stiff neck, dyspnea, wheezing, rash, vomiting, diarrhea, use of an antipyretic, and response to antipyretic.  The patient denies itchy throat, sneezing, seasonal symptoms, headache, muscle aches, and severe fatigue.  Risk factors for Strep sinusitis include unilateral facial pain, unilateral nasal discharge, poor response to decongestant, and double sickening.  The patient denies the following risk factors for Strep sinusitis: tooth pain, Strep exposure, tender adenopathy, and absence of cough.    Preventive Screening-Counseling & Management  Alcohol-Tobacco     Alcohol drinks/day: 0     Alcohol Counseling: not indicated; patient does not drink     Smoking  Status: quit     Tobacco Counseling: to remain off tobacco products  Hep-HIV-STD-Contraception     Hepatitis Risk: no risk noted     HIV Risk: no risk noted     STD Risk: no risk noted      Drug Use:  no.    Medications Prior to Update: 1)  Atenolol 25 Mg Tabs (Atenolol) .... 1/2 Tablet Two Times A Day 2)  Levothyroxine Sodium 25 Mcg Tabs (Levothyroxine Sodium) .Marland Kitchen.. 1 Qd 3)  Dulera 100-5 Mcg/act Aero (Mometasone Furo-Formoterol Fum) .... 2 Puffs Two Times A Day For Asthma 4)  Mytussin Ac 100-10 Mg/49ml Syrp (Guaifenesin-Codeine) .... 5-10 Ml By Mouth Qid As Needed For Cough  Current Medications (verified): 1)  Atenolol 25 Mg Tabs (Atenolol) .... 1/2 Tablet Two Times A Day 2)  Levothyroxine Sodium 25 Mcg Tabs (Levothyroxine Sodium) .Marland Kitchen.. 1 Qd 3)  Dulera 100-5 Mcg/act Aero (Mometasone Furo-Formoterol Fum) .... 2 Puffs Two Times A Day For Asthma 4)  Ceftin 500 Mg Tab (Cefuroxime Axetil) .... Take One (1) Tablet By Mouth Two (2) Times A Day X 10 Days  Allergies (verified): 1)  ! * Z Pack  Past History:  Past Medical History: Last updated: 11/11/2008 Current Problems:  HYPERTENSION (ICD-401.9) MITRAL VALVE PROLAPSE (ICD-424.0) MITRAL INSUFFICIENCY (ICD-396.3) THYROIDITIS (ICD-245.9) SINUSITIS- ACUTE-NOS (ICD-461.9) GERD (ICD-530.81) GAS/BLOATING (ICD-787.3) NONSPECIFIC ABNORMAL RESULTS LIVR FUNCTION STUDY (ICD-794.8) UNSPECIFIED VITAMIN  D DEFICIENCY (ICD-268.9) AUTOIMMUNE HEPATITIS (ICD-571.42) EDEMA (ICD-782.3) HYPOTHYROIDISM (ICD-244.9) Anxiety Disorder Gallstones  Past Surgical History: Last updated: 11/11/2008 Cholecystectomy Hysterectomy  mitral valve surgery in [place].  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  Alcohol Use - no Daily Caffeine Use Illicit Drug Use - no Patient does not get regular exercise.   Risk Factors: Alcohol Use: 0 (04/09/2010) Exercise: no (05/05/2008)  Risk Factors: Smoking Status: quit (04/09/2010)  Family History: Reviewed history from 08/24/2008 and no changes required. Family History of Colitis/Crohn's: Paternal Cousin x 3 Family History of Heart Disease: Paternal Grandmother Family History of Liver Disease/Cirrhosis:Mother Family History Lung Cancer: Father (smoker) mother (deceased) and a sister have uncertain type of thyroid dz  Social History: Reviewed history from 05/05/2008 and no changes required. 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  Alcohol Use - no Daily Caffeine Use Illicit Drug Use - no Patient does not get regular exercise.  Hepatitis Risk:  no risk noted HIV Risk:  no risk noted STD Risk:  no risk noted  Review of Systems  The patient denies anorexia, weight loss, weight gain, chest pain, syncope, dyspnea on exertion, peripheral edema, headaches, hemoptysis, abdominal pain, suspicious skin lesions, and enlarged lymph nodes.    Physical Exam  General:  overweight-appearing.  alert, well-developed, well-nourished, and cooperative to examination.    Head:  normocephalic, atraumatic, no abnormalities observed, and no abnormalities palpated.   Ears:  R ear normal and L ear normal.   Nose:  no external deformity, no airflow obstruction, no intranasal foreign body, no nasal polyps, no nasal mucosal lesions, no septum abnormalities, nasal dischargemucosal pallor, mucosal erythema, mucosal edema, L maxillary sinus tenderness, and R maxillary sinus tenderness.   Mouth:  good dentition, pharynx pink and moist, no erythema, no exudates, no posterior lymphoid hypertrophy, no postnasal  drip, no pharyngeal crowing, no lesions, no aphthous ulcers, no erosions, and no tongue abnormalities.   Neck:  supple, full ROM, no masses, no thyromegaly, no JVD, normal carotid upstroke, no carotid bruits, no cervical lymphadenopathy, and no neck tenderness.   Lungs:  normal respiratory effort, no intercostal retractions, no accessory muscle use, normal breath sounds, no dullness, no fremitus, no crackles, R  faint inspri wheezes, and L faint inspir wheezes.  she has good air movement. Heart:  normal rate, regular rhythm, no murmur, no gallop, no rub, and no JVD.   Abdomen:  soft, non-tender, normal bowel sounds, no distention, no masses, no guarding, no rigidity, no rebound tenderness, no abdominal hernia, no inguinal hernia, no hepatomegaly, and no splenomegaly.   Msk:  No deformity or scoliosis noted of thoracic or lumbar spine.   Pulses:  R and L carotid,radial,femoral,dorsalis pedis and posterior tibial pulses are full and equal bilaterally Extremities:  No clubbing, cyanosis, edema, or deformity noted with normal full range of motion of all joints.   Neurologic:  No cranial nerve deficits noted. Station and gait are normal. Plantar reflexes are down-going bilaterally. DTRs are symmetrical throughout. Sensory, motor and coordinative functions appear intact. Skin:  Intact without suspicious lesions or rashes Cervical Nodes:  No lymphadenopathy noted Axillary Nodes:  No palpable lymphadenopathy Psych:  Cognition and  judgment appear intact. Alert and cooperative with normal attention span and concentration. No apparent delusions, illusions, hallucinations   Impression & Recommendations:  Problem # 1:  SINUSITIS- ACUTE-NOS (ICD-461.9) Assessment Deteriorated  The following medications were removed from the medication list:    Mytussin Ac 100-10 Mg/54ml Syrp (Guaifenesin-codeine) .Marland Kitchen... 5-10 ml by mouth qid as needed for cough Her updated medication list for this problem includes:    Ceftin  500 Mg Tab (Cefuroxime axetil) .Marland Kitchen... Take one (1) tablet by mouth two (2) times a day x 10 days  Instructed on treatment. Call if symptoms persist or worsen.   Complete Medication List: 1)  Atenolol 25 Mg Tabs (Atenolol) .... 1/2 tablet two times a day 2)  Levothyroxine Sodium 25 Mcg Tabs (Levothyroxine sodium) .Marland Kitchen.. 1 qd 3)  Dulera 100-5 Mcg/act Aero (Mometasone furo-formoterol fum) .... 2 puffs two times a day for asthma 4)  Ceftin 500 Mg Tab (Cefuroxime axetil) .... Take one (1) tablet by mouth two (2) times a day x 10 days  Patient Instructions: 1)  Please schedule a follow-up appointment in 1 month. 2)  Take your antibiotic as prescribed until ALL of it is gone, but stop if you develop a rash or swelling and contact our office as soon as possible. 3)  Acute sinusitis symptoms for less than 10 days are not helped by antibiotics.Use warm moist compresses, and over the counter decongestants ( only as directed). Call if no improvement in 5-7 days, sooner if increasing pain, fever, or new symptoms. Prescriptions: CEFTIN 500 MG TAB (CEFUROXIME AXETIL) Take one (1) tablet by mouth two (2) times a day X 10 days  #20 x 1   Entered and Authorized by:   Etta Grandchild MD   Signed by:   Etta Grandchild MD on 04/09/2010   Method used:   Electronically to        [facility] (retail)       [address]       [place], [place]  [address]       Ph: [phone]       Fax: [phone]   RxID:   [id number]    Orders Added: 1)  Est. Patient Level IV [25366]

## 2010-06-06 ENCOUNTER — Ambulatory Visit: Payer: Self-pay | Admitting: Internal Medicine

## 2010-06-21 ENCOUNTER — Encounter: Payer: Self-pay | Admitting: Internal Medicine

## 2010-06-21 LAB — HM PAP SMEAR: HM Pap smear: NORMAL

## 2010-07-10 ENCOUNTER — Other Ambulatory Visit (INDEPENDENT_AMBULATORY_CARE_PROVIDER_SITE_OTHER): Payer: 59

## 2010-07-10 ENCOUNTER — Ambulatory Visit (INDEPENDENT_AMBULATORY_CARE_PROVIDER_SITE_OTHER): Payer: 59 | Admitting: Internal Medicine

## 2010-07-10 ENCOUNTER — Encounter: Payer: Self-pay | Admitting: Internal Medicine

## 2010-07-10 VITALS — BP 108/64 | HR 77 | Temp 98.4°F | Wt 228.5 lb

## 2010-07-10 DIAGNOSIS — E039 Hypothyroidism, unspecified: Secondary | ICD-10-CM

## 2010-07-10 DIAGNOSIS — Z0000 Encounter for general adult medical examination without abnormal findings: Secondary | ICD-10-CM | POA: Insufficient documentation

## 2010-07-10 LAB — LIPID PANEL
LDL Cholesterol: 114 mg/dL — ABNORMAL HIGH (ref 0–99)
Total CHOL/HDL Ratio: 5
Triglycerides: 73 mg/dL (ref 0.0–149.0)

## 2010-07-10 LAB — CBC WITH DIFFERENTIAL/PLATELET
Basophils Absolute: 0.2 10*3/uL — ABNORMAL HIGH (ref 0.0–0.1)
Eosinophils Relative: 4.1 % (ref 0.0–5.0)
HCT: 38.7 % (ref 36.0–46.0)
Hemoglobin: 13.2 g/dL (ref 12.0–15.0)
Lymphocytes Relative: 22.7 % (ref 12.0–46.0)
Monocytes Relative: 6.8 % (ref 3.0–12.0)
Platelets: 273 10*3/uL (ref 150.0–400.0)
RDW: 13.3 % (ref 11.5–14.6)
WBC: 7.7 10*3/uL (ref 4.5–10.5)

## 2010-07-10 LAB — HM MAMMOGRAPHY

## 2010-07-10 NOTE — Patient Instructions (Addendum)
Lose weight and exercise, follow-up in 2-3 monthsHypothyroidism The thyroid is a large gland located in the lower front of your neck. The thyroid gland helps control metabolism. Metabolism is how your body handles food. It controls metabolism with the hormone thyroxine. When this gland is underactive (hypothyroid), it produces too little hormone.  SYMPTOMS OF HYPOTHYROIDISM  Lethargy (feeling as though you have no energy)   Cold intolerance   Weight gain (in spite of normal food intake)   Dry skin   Coarse hair   Menstrual irregularity (if severe, may lead to infertility)   Slowing of thought processes  Cardiac problems are also caused by insufficient amounts of thyroid hormone. Hypothyroidism in the newborn is cretinism, and is an extreme form. It is important that this form be treated adequately and immediately or it will lead rapidly to retarded physical and mental development. CAUSES OF HYPOTHYROIDISM These include:   Absence or destruction of thyroid tissue.  Goiter due to iodine deficiency.   Goiter due to medications.   Congenital defects (since birth).  Problems with the pituitary. This causes a lack of TSH (thyroid stimulating hormone). This hormone tells the thyroid to turn out more hormone.   DIAGNOSIS To prove hypothyroidism, your caregiver may do blood tests and ultrasound tests. Sometimes the signs are hidden. It may be necessary for your caregiver to watch this illness with blood tests either before or after diagnosis and treatment. TREATMENT  Low levels of thyroid hormone are increased by using synthetic thyroid hormone. This is a safe, effective treatment. It usually takes about four weeks to gain the full effects of the medication. After you have the full effect of the medication, it will generally take another four weeks for problems to leave. Your caregiver may start you on low doses. If you have had heart problems the dose may be gradually increased. It is  generally not an emergency to get rapidly to normal. HOME CARE INSTRUCTIONS  Take your medications as your caregiver suggests. Let your caregiver know of any medications you are taking or start taking. Your caregiver will help you with dosage schedules.   As your condition improves, your dosage needs may increase. It will be necessary to have continuing blood tests as suggested by your caregiver.   Report all suspected medication side effects to your caregiver.  SEEK MEDICAL CARE IF YOU DEVELOP:  Sweating.  Tremulousness (tremors).   Anxiety.   Rapid weight loss.   Heat intolerance.  Emotional swings.   Diarrhea.   Weakness.   SEEK IMMEDIATE MEDICAL CARE IF: You develop chest pain, an irregular heart beat (palpitations), or a rapid heart beat. MAKE SURE YOU:   Understand these instructions.   Will watch your condition.   Will get help right away if you are not doing well or get worse.  Document Released: 04/08/2005 Document Re-Released: 03/21/2008 [facility] Patient Information 2011 [place], [place].

## 2010-07-10 NOTE — Progress Notes (Signed)
  Subjective:    Patient ID: Marilyn Dougherty, female    DOB: 1965/08/07, 45 y.o.   MRN: [id number]  HPIShe returns for a physical but she tells me that she saw her Gyn 3 weeks ago. Her only complaint today is fatigue and weight gain.    Review of Systems  Constitutional: Negative for fever, chills, diaphoresis, activity change, appetite change, fatigue and unexpected weight change.  HENT: Negative for neck pain and neck stiffness.   Respiratory: Negative for chest tightness, shortness of breath and wheezing.   Cardiovascular: Negative for chest pain, palpitations and leg swelling.  Gastrointestinal: Negative for nausea, abdominal pain, diarrhea, constipation and blood in stool.  Genitourinary: Negative for hematuria, vaginal bleeding, vaginal discharge, difficulty urinating and menstrual problem.  Musculoskeletal: Negative for back pain and arthralgias.  Neurological: Negative for dizziness, syncope, weakness, light-headedness and headaches.  Hematological: Negative for adenopathy.  Psychiatric/Behavioral: Negative for behavioral problems, confusion, dysphoric mood, decreased concentration and agitation.       Objective:   Physical Exam  Constitutional: She is oriented to person, place, and time. She appears well-nourished. No distress.  HENT:  Head: Normocephalic and atraumatic.  Eyes: Conjunctivae and EOM are normal. Left eye exhibits no discharge.  Neck: No JVD present. No thyromegaly present.  Cardiovascular: Normal rate, regular rhythm and normal heart sounds.  Exam reveals no gallop and no friction rub.   No murmur heard. Pulmonary/Chest: No stridor. No respiratory distress. She has no wheezes. She has no rales. She exhibits no tenderness.  Abdominal: She exhibits no distension and no mass. There is no tenderness. There is no rebound and no guarding.  Musculoskeletal: She exhibits no edema and no tenderness.  Lymphadenopathy:    She has no cervical adenopathy.  Neurological: She  is alert and oriented to person, place, and time. She has normal reflexes. No cranial nerve deficit. She exhibits normal muscle tone. Coordination normal.  Skin: Skin is warm and dry. No rash noted. She is not diaphoretic. No erythema. No pallor.  Psychiatric: She has a normal mood and affect. Her behavior is normal. Judgment and thought content normal.          Assessment & Plan:

## 2010-07-10 NOTE — Assessment & Plan Note (Signed)
Check TSH and change dose if needed

## 2010-07-11 ENCOUNTER — Encounter: Payer: Self-pay | Admitting: Internal Medicine

## 2010-07-21 ENCOUNTER — Other Ambulatory Visit: Payer: Self-pay | Admitting: Endocrinology

## 2010-07-21 ENCOUNTER — Other Ambulatory Visit: Payer: Self-pay | Admitting: Cardiovascular Disease

## 2010-07-25 ENCOUNTER — Ambulatory Visit: Payer: Self-pay | Admitting: Cardiovascular Disease

## 2010-08-07 ENCOUNTER — Encounter: Payer: Self-pay | Admitting: Cardiovascular Disease

## 2010-08-07 ENCOUNTER — Ambulatory Visit (INDEPENDENT_AMBULATORY_CARE_PROVIDER_SITE_OTHER): Payer: 59 | Admitting: Cardiovascular Disease

## 2010-08-07 VITALS — BP 109/67 | HR 74 | Resp 14 | Ht 68.0 in | Wt 230.0 lb

## 2010-08-07 DIAGNOSIS — I080 Rheumatic disorders of both mitral and aortic valves: Secondary | ICD-10-CM

## 2010-08-07 NOTE — Assessment & Plan Note (Signed)
No change in murmur.  F/U echo to assess and make sure LV compensated.  Sedentary but functional class one

## 2010-08-07 NOTE — Patient Instructions (Signed)
Your physician recommends that you schedule a follow-up appointment in: ONE YEAR  Your physician has requested that you have an echocardiogram. Echocardiography is a painless test that uses sound waves to create images of your heart. It provides your doctor with information about the size and shape of your heart and how well your heart's chambers and valves are working. This procedure takes approximately one hour. There are no restrictions for this procedure.    

## 2010-08-07 NOTE — Progress Notes (Signed)
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  I reviewed her echo from today.  She had restricted motion of the posteriror leaflet but the prolapse was difficult to visualize.  I thought her MR was more moderate in nature. with normal LV cavity size and funciton.  She is asymptomatic with no dyspnea, PND, orthopnea edema or palpitaitons.   Had a normal myovue in 2011.    Her recent TSH was normal and she sees Dr Everardo All for her thyroiditis  Got a promotion at [employer] and travels to [place] and [place] often.  Has been seeing dentist and is on amoxacillin for pain in left upper teeth. Has FU appt  08/14/09 Echo     - Left ventricle: The cavity size was normal. Wall thickness was       normal. Systolic function was normal. The estimated ejection       fraction was in the range of 55% to 60%. Wall motion was normal;       there were no regional wall motion abnormalities. Doppler       parameters are consistent with high ventricular filling pressure.     - Mitral valve: Mobility of the posterior leaflet was mildly       restricted. The findings are consistent with mild stenosis.       Moderate to severe regurgitation. Valve area by pressure       half-time: 1.77cm 2.  ROS: Denies fever, malais, weight loss, blurry vision, decreased visual acuity, cough, sputum, SOB, hemoptysis, pleuritic pain, palpitaitons, heartburn, abdominal pain, melena, lower extremity edema, claudication, or rash.   General: Affect appropriate Healthy:  appears stated age HEENT: normal Neck supple with no adenopathy JVP normal no bruits no thyromegaly Lungs clear with no wheezing and good diaphragmatic motion Heart:  S1/S2 MR  Murmur no ,rub, gallop or click PMI normal Abdomen: benighn, BS positve, no tenderness, no AAA no bruit.  No HSM or HJR Distal pulses intact with no bruits No edema Neuro non-focal Skin warm and dry No muscular weakness   Current Outpatient Prescriptions  Medication Sig  Dispense Refill  . atenolol (TENORMIN) 25 MG tablet TAKE 1/2 TABLET BY MOUTH TWICE A DAY  30 tablet  11  . levothyroxine (SYNTHROID, LEVOTHROID) 25 MCG tablet TAKE 1 TABLET BY MOUTH ONCE DAILY  30 tablet  2  . Mometasone Furo-Formoterol Fum (DULERA) 100-5 MCG/ACT AERO Inhale 2 puffs into the lungs 2 (two) times daily.          Allergies  Zithromax  Electrocardiogram:  NSR 74 normal ecg  Assessment and Plan e

## 2010-08-13 ENCOUNTER — Ambulatory Visit (HOSPITAL_COMMUNITY): Payer: 59 | Attending: Cardiovascular Disease

## 2010-08-13 DIAGNOSIS — I080 Rheumatic disorders of both mitral and aortic valves: Secondary | ICD-10-CM

## 2010-08-13 DIAGNOSIS — I059 Rheumatic mitral valve disease, unspecified: Secondary | ICD-10-CM

## 2010-08-13 DIAGNOSIS — I379 Nonrheumatic pulmonary valve disorder, unspecified: Secondary | ICD-10-CM | POA: Insufficient documentation

## 2010-08-13 DIAGNOSIS — I079 Rheumatic tricuspid valve disease, unspecified: Secondary | ICD-10-CM | POA: Insufficient documentation

## 2010-09-04 NOTE — Assessment & Plan Note (Signed)
[facility]                            CARDIOLOGY OFFICE NOTE   Marilyn Dougherty, Marilyn Dougherty                       MRN:          [id number]  DATE:05/19/2008                            DOB:          April 05, 1966    Marilyn Dougherty returns today for followup.  She is a 45 year old patient who was  advised to have a mitral valve surgery in [facility].  She came to me  for a second opinion.   Since I last saw her I did receive a transesophageal echo disk from  [facility], date on the exam is January 19, 2008.  Review of  the TE shows somewhat of a suboptimal study.  She appears to have a  small flail or prolapsing middle scallop to the posterior leaflet,  however, by color flow at least in most range only mild mitral  insufficiency and a most moderate insufficiency.   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  She has been having  some pain and swelling in her lower extremity.   Apparently, she had a thyroid uptake study.  She said that she did not  have the results, was told that she did not need to be on PTU, obviously  her Synthroid has been stopped.   I am still concerned that she has active hyperthyroidism.  I told her I  would like to recheck her lab work today.  I suggested that Dr.  Roanna Raider,  who is one of the partners in the [facility] that she is seeing to  review her case.   If she does have Hashimoto thyroiditis I wonder she should not be on  steroids or something else.   She actually said she felt better while taking PTU.   From a cardiac perspective, she has mild dyspnea.  There has been no  palpitations or syncope.  No chest pain.  She had normal coronaries by  cath in [facility].   Again, we had a long discussion regarding her valve.  I showed her  pictures from the TE.  There is currently certainly no rush to proceed  with mitral valve surgery in the  setting of less than severe mitral  insufficiency.  No evidence of pulmonary hypertension and normal LV  function and size.   REVIEW OF SYSTEMS:  Otherwise, negative in particular she has not had  atrial fibrillation, TIA, CVA.  She has had some blurry vision and this  may be related to her thyroid.  She apparently had some secondary liver  test done and was told that they were okay.   Her current medicines only include:  1. Atenolol 12.5 b.i.d.  2. Vitamin D.   ALLERGIES:  She is allergic to Z-PAK.   PHYSICAL EXAMINATION:  GENERAL:  Her exam is remarkable for possible  exophthalmos.  VITAL SIGNS:  Heart rate is 70 and regular, blood pressure 120/80,  respiratory rate 14, and afebrile.  HEENT:  Unremarkable other than prominent eyes.  NECK:  No thyromegaly, lymphadenopathy, or no JVP elevation.  No carotid  bruit, although there is a murmur that radiates to the right carotid.  LUNGS:  Clear.  Good diaphragmatic motion.  No wheezing.  S1 and S2 with  an MR murmur.  PMI normal.  ABDOMEN:  Benign.  Bowel sounds positive.  No AAA, no tenderness, no  bruit, no hepatosplenomegaly, no hepatojugular reflux. EXTREMITIES:  Distal pulses are intact with +1 edema bilaterally.  NEUROLOGIC:  Nonfocal.  SKIN:  Warm and dry.  MUSCULOSKELETAL:  No muscular weakness.   EKG normal.   IMPRESSION:  1. Mitral insufficiency.  Transesophageal echo images were reviewed      from [facility].  The patient would not appear to have severe      mitral regurgitation, currently no indications for surgery.      Consider followup transesophageal echo here at [facility] by myself in      March.  2. Thyroid abnormalities.  Followup with endocrinologist, question of      Hashimoto.  Continue beta-blockade.  The patient felt better on      PTU.  I gave her specific questions to ask Dr. Roanna Raider, her      endocrinologist regarding possibility of steroids and timing of her      course of her autoimmune disease.  We will  check her thyroid and      TSH at this time.  She is not a candidate for any heart surgery so      long as she has active autoimmune issues.  3. Abnormal liver function tests not related to right heart failure.      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  4. Lower extremity edema.  We will check a BMET and a BMP today.      Again, I think this is more to do with her endocrine issues.      However, depending on the results of her lab, she may benefit from      a low dose of the diuretic.   Again, I talked to [place] extensively and reassured her that I really did  not think that she needed to have surgery now.  She was told in the [facility] that she needed to have surgery within the next 6 months and I  think this would be the wrong thing to do.  She seemed to happy with  these plans and I suspect we will arrange a followup TE in March.     Noralyn Pick. Eden Emms, MD, [facility]  Electronically Signed    PCN/MedQ  DD: 05/19/2008  DT: 05/20/2008  Job #: [id number]

## 2010-09-04 NOTE — Assessment & Plan Note (Signed)
[facility]                            CARDIOLOGY OFFICE NOTE   Marilyn Dougherty, Marilyn Dougherty                       MRN:          [id number]  DATE:05/02/2008                            DOB:          1965-08-02    Ms. Ask is 45 year old patient who is self-referred for second  opinion regarding need for mitral valve surgery.   She has previously been seen by Dr. Lorelei Pont at [facility]  [place].  The patient says she has known about having mitral valve  prolapse for about 5 years.  She has had some exertional dyspnea and  diaphoresis.  She was worked up by Dr. Willeen Cass, unfortunately do not have  any actual images from her echo or transesophageal echo.  I reviewed the  reports which indicated she had normal left ventricular size and  function with moderate-to-severe mitral insufficiency and prolapse of  the posterior leaflet.   Her coronary risk factors include hypertension and hyperlipidemia.   She was started on a beta-blocker.   The patient subsequently had a transesophageal echocardiogram, the  report of which I do not have.  She was referred for heart  catheterization which was done in November.  There was no significant  coronary artery disease.  There was normal LV function.  I do not have  any right heart pressures from the patient.   However, the patient appears to have an autoimmune process going on.  She initially had significant blurry vision, sweats, muscle fatigue, and  was empirically placed on Synthroid by her primary care doctor for day  or two.  However, her lab work came back showing significant  hyperthyroidism with a TSH of 0.05 and an elevated free T4 in the 2.5  range.  She subsequently Ginette Otto who works with Dr. Kathrynn Humble and  Dr. Ocie Cornfield at [facility].  She was started on  methimazole in addition to her beta-blocker and is to have a nuclear  scan done on the 19th.  I told Marilyn Dougherty that there was no  rush in making  decision about mitral valve surgery.  She was somewhat teary-eyed and  said that Dr. Willeen Cass said she only had 6 months.  Otherwise, she would  have heart failure.  I explained to her that as far as I could tell from  a transthoracic report, she has normal LV size and function.  Her MR was  only graded at moderate to severe.  There is no clinical evidence of  pulmonary hypertension, although I do not have actual right heart  pressures, also I am convinced that most of the symptoms she has been  complaining about have more to do with her hyperthyroid state than her  heart.   Clearly, I do not think we missed any window regarding fixing her heart  valve in regards to irreparable LV dysfunction.   She seemed to be relieved to hear this of more concern also, and is a  fact that the patient has had elevated LFTs, autoimmune disease runs in  the family as the mother had autoimmune hepatitis and thyroid disease  well as well.  Given the patient's recent diagnosis of thyroid problems,  I am concerned about her elevated LFTs.  I do not think they are related  to her heart problem and would really like to find out in her right  heart catheterization was done.  The patient does not recall having had  two catheters placed and the patient says she has had a right upper  quadrant ultrasound, which did not show any problems.   The patient's review of systems is otherwise remarkable for atypical  chest pains.  We know she does not have coronary artery disease by cath.  She has had mild swelling in the ankles of palpitations, headaches,  dyspnea, dizziness, and sweats.   She is allergic to Z-PAK.   Her past medical history is otherwise remarkable for previous  cholecystectomy and hysterectomy.   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  She is  married.  Her husband was with her.  She quit smoking in October.  She  does not drink.  She has one 46 year old  daughter.   Family history is remarkable for autoimmune disease in the mother who  had hepatitis and father who had cancer.  Both of deceased.   CURRENT MEDICATIONS:  1. Cyclobenzaprine half a tablet at night.  2. Methimazole 5 mg q.12 to be held 5 days before nuclear study.  3. Atenolol 12.5 b.i.d.  4. Vitamin D.   PHYSICAL EXAMINATION:  GENERAL:  Remarkable for an anxious female with  some exophthalmos.  VITAL SIGNS:  Weight is 226, blood pressure 120/77, pulse 87 and  regular, respiratory rate 14, afebrile.  HEENT:  Unremarkable.  NECK:  No thyromegaly and no lymphadenopathy.  JVP is mildly elevated.  LUNGS:  Clear, good diaphragmatic motion.  No wheezing.  CARDIAC:  S1, S2 with anteriorly-directed MR murmur.  PMI is normal.  No  RV heave.  ABDOMEN:  Benign.  No hepatojugular reflux.  No tenderness.  No  hepatosplenomegaly.  No AAA.  No bruit.  EXTREMITIES:  Distal pulses are intact.  No edema.  NEUROLOGIC:  Nonfocal.  Reflexes are brisk and not probably hyperactive.  SKIN:  Warm and dry.  MUSCULOSKELETAL:  No muscular weakness.   Electrocardiogram is normal reviewed approximately 30 records from  [facility].   The patient's 2-D echocardiogram which was dated January 08, 2008  shows a left ventricular internal dimension to 56 in diastole and 36 in  systole.  EF was 65%.  Mitral valve appeared thickened with prolapse of  the posterior leaflet and moderate-to-severe MR.   I do not have a TEE report.   The patient did not have right heart pressures documented on her cath  and I fear they were not done since there was an LV pressure of 125/13  recorded and an aortic pressure of 133/81 recorded.   The patient's lab work showed a TSH of 0.05 and free T4 of 2.5.   There are indication from the notes that the patient had elevated LFTs,  but I do not actually have these.   IMPRESSION:  1. Mitral valve prolapse.  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  Furthermore, I think that the      patient may  not be symptomatic from her mitral valve.  It was only      graded as moderate-to-severe.  There is no documentation of      elevated pulmonary artery pressures.  They were not elevated by      transthoracic echocardiogram and all of her symptoms can be      explained by her hyperthyroid state.  For the time being I will try      to get the actual images from a transesophageal echocardiogram and      transthoracic echocardiogram and we will continue her current beta-      blocker.  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  Unfortunately, I      suspect we will need to repeat our own studies here including a      right heart catheterization that this was not done.  2. Hyperthyroidism.  Follow up with Dr. Roanna Raider and Dr. Katrinka Blazing.  Continue      methimazole and beta-blocker.  Hopefully, she will receive      radioactive iodine and we will reassess her heart when her thyroid      studies have normalized.  3. 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  We will try to get further lab      work in regards to her hepatitis status and LFTs.  We will refer      her to our Gastrointestinal Department to see if any further workup      is needed in regards to this abnormality.  4. Hypertension, currently well controlled.  Continue current dose of      atenolol.  5. Vitamin D deficiency.  Continue current dose of replacement.   Quite a bit of time was spent with the patient explaining to her the  sequence of events that would need to happen in the long-term natural  history of mitral regurgitation as well as the fact that I did not think  she needed to rush into  having surgery within the next 6 months  particularly giving her other comorbidities.  I will see her back in  about 6 weeks and see how her thyroid is doing and see if we can in the  meantime get the actual images from her echocardiograms, studies and see  if there are right heart records from her heart catheterization.     Marilyn Pick. Eden Emms, MD, [facility]  Electronically Signed    PCN/MedQ  DD: 05/02/2008  DT: 05/03/2008  Job #: [id number]

## 2010-11-06 ENCOUNTER — Other Ambulatory Visit: Payer: Self-pay | Admitting: Endocrinology

## 2011-02-08 IMAGING — CR DG CHEST 2V
2 series · 2 of 2 positions shown · non-contrast
Comparison: 02/28/2009.

CLINICAL DATA: History of coughing, chest pain, shortness of
breath, tobacco smoking history, and hypertension.

CHEST - 2 VIEW

[view not recorded (1 of 2)]
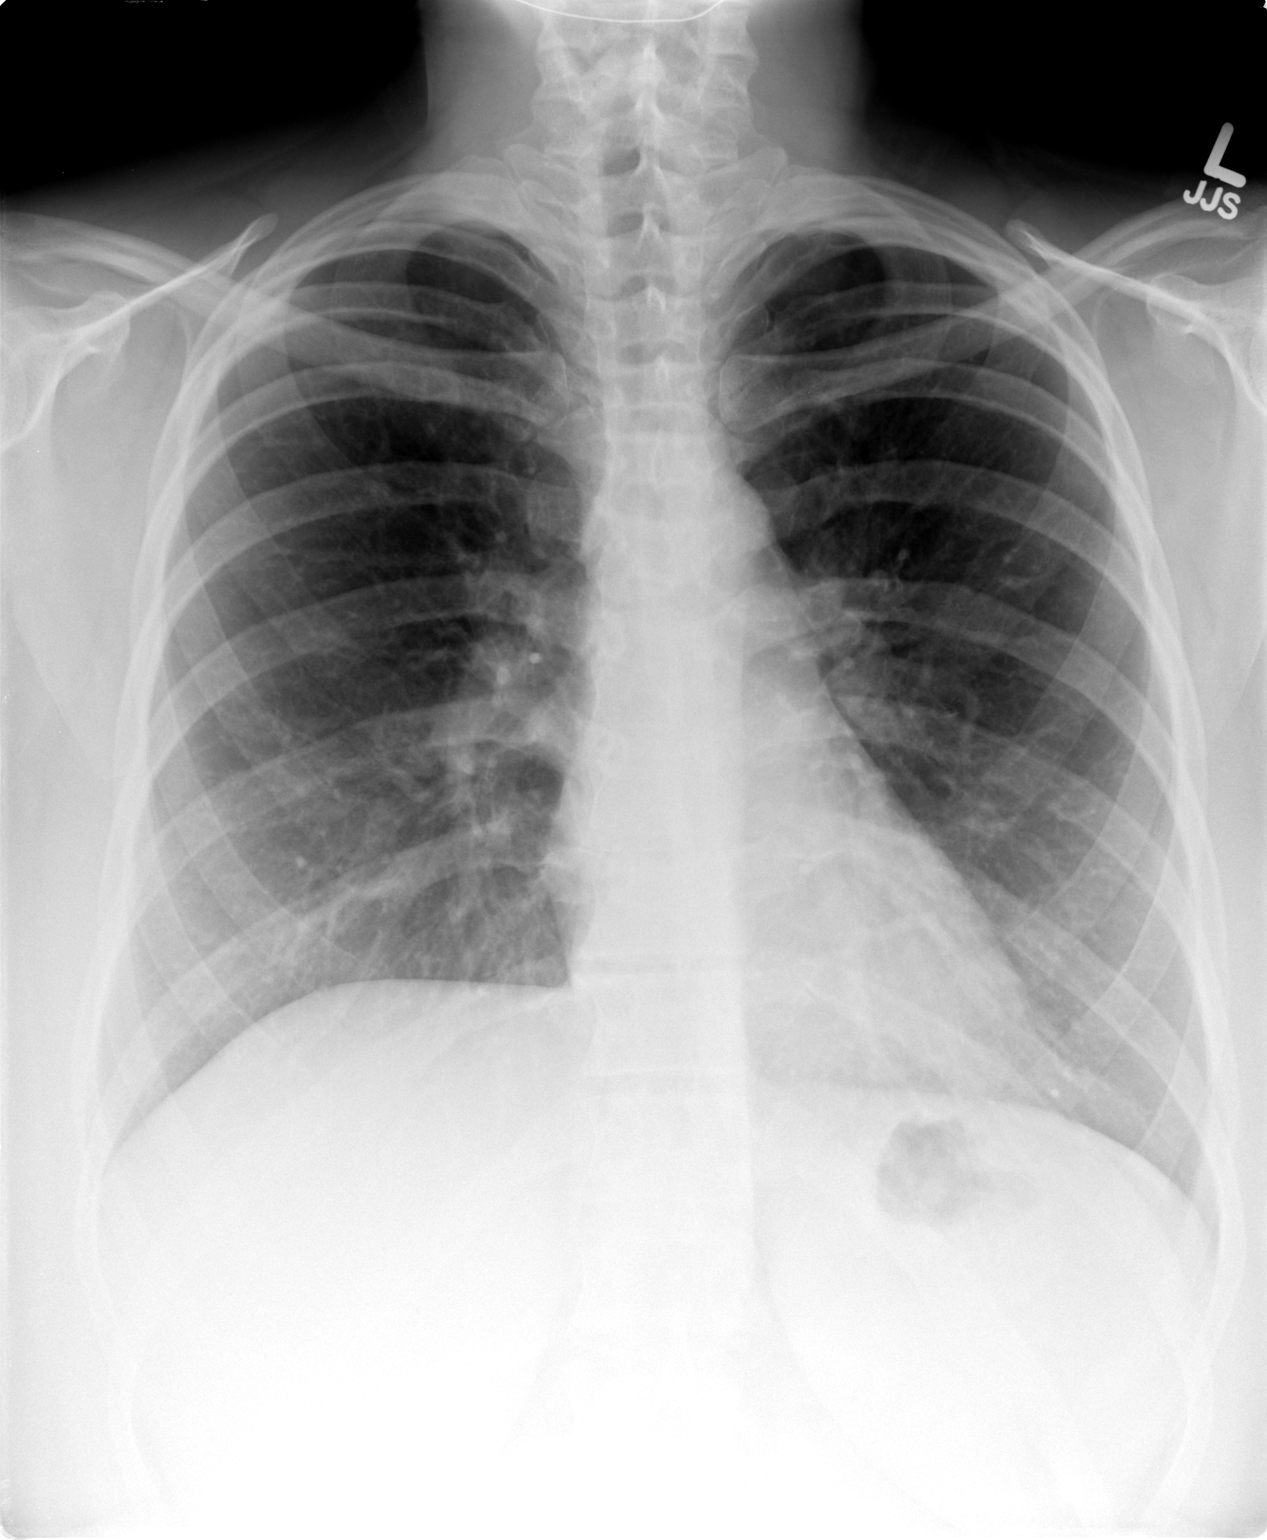

[view not recorded (2 of 2)]
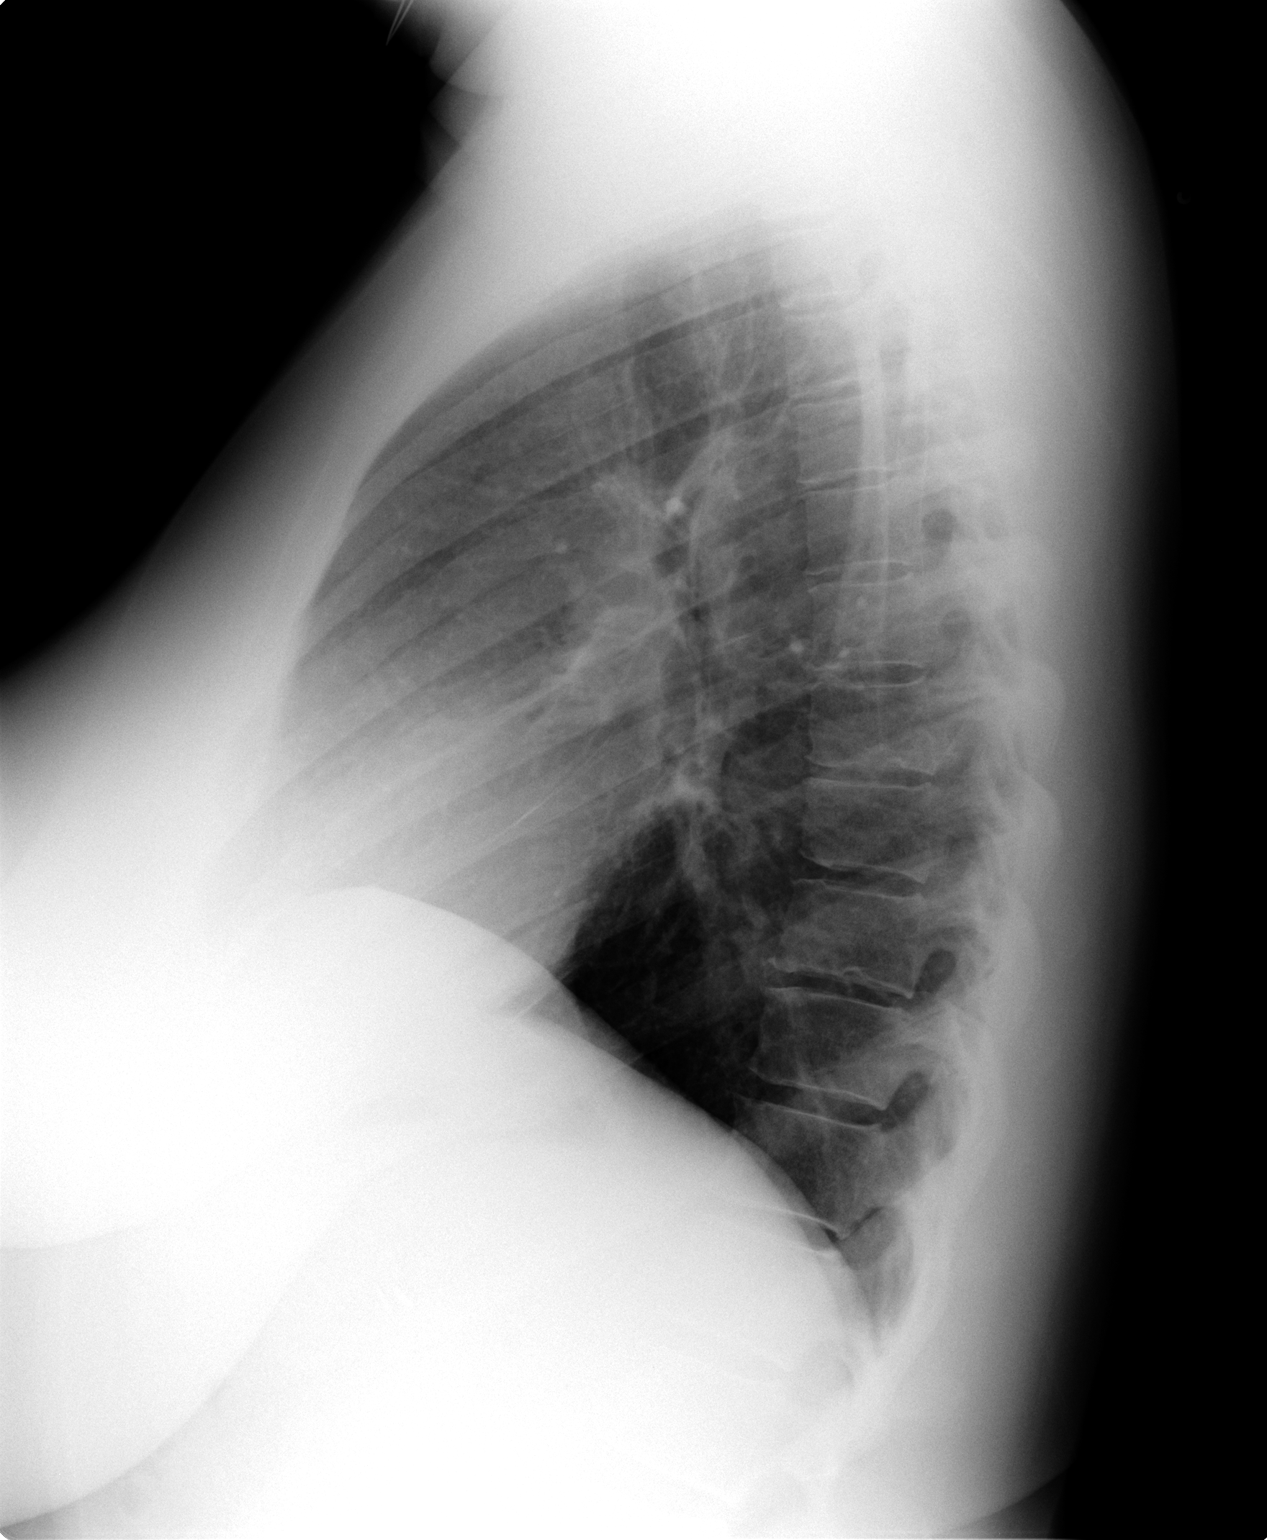

[2 of 2 positions shown; findings below may reference images not displayed]

FINDINGS: The cardiac silhouette is normal size and shape. No
pleural abnormality is evident. The lungs are well aerated and free
of infiltrates. Bones appear average for age. Small granulomatous
calcifications present.  No active granulomatous process is
identified.
IMPRESSION: Stable appearance of chest.  No acute or active process is
identified.

## 2011-02-09 ENCOUNTER — Other Ambulatory Visit: Payer: Self-pay | Admitting: Endocrinology

## 2011-02-26 ENCOUNTER — Encounter: Payer: Self-pay | Admitting: Internal Medicine

## 2011-02-26 ENCOUNTER — Ambulatory Visit (INDEPENDENT_AMBULATORY_CARE_PROVIDER_SITE_OTHER): Payer: 59 | Admitting: Internal Medicine

## 2011-02-26 ENCOUNTER — Other Ambulatory Visit (INDEPENDENT_AMBULATORY_CARE_PROVIDER_SITE_OTHER): Payer: 59

## 2011-02-26 VITALS — BP 118/78 | HR 69 | Temp 98.1°F | Resp 16 | Wt 233.0 lb

## 2011-02-26 DIAGNOSIS — E039 Hypothyroidism, unspecified: Secondary | ICD-10-CM

## 2011-02-26 DIAGNOSIS — K754 Autoimmune hepatitis: Secondary | ICD-10-CM

## 2011-02-26 DIAGNOSIS — E559 Vitamin D deficiency, unspecified: Secondary | ICD-10-CM

## 2011-02-26 DIAGNOSIS — Z23 Encounter for immunization: Secondary | ICD-10-CM

## 2011-02-26 LAB — LIPID PANEL
Cholesterol: 170 mg/dL (ref 0–200)
LDL Cholesterol: 115 mg/dL — ABNORMAL HIGH (ref 0–99)
Total CHOL/HDL Ratio: 4

## 2011-02-26 LAB — CBC WITH DIFFERENTIAL/PLATELET
Eosinophils Absolute: 0.3 10*3/uL (ref 0.0–0.7)
Eosinophils Relative: 3.3 % (ref 0.0–5.0)
HCT: 41.1 % (ref 36.0–46.0)
Lymphs Abs: 1.5 10*3/uL (ref 0.7–4.0)
MCHC: 33.1 g/dL (ref 30.0–36.0)
MCV: 88.4 fl (ref 78.0–100.0)
Monocytes Absolute: 0.5 10*3/uL (ref 0.1–1.0)
Platelets: 318 10*3/uL (ref 150.0–400.0)
WBC: 7.5 10*3/uL (ref 4.5–10.5)

## 2011-02-26 LAB — COMPREHENSIVE METABOLIC PANEL
ALT: 20 U/L (ref 0–35)
AST: 15 U/L (ref 0–37)
Albumin: 4 g/dL (ref 3.5–5.2)
CO2: 29 mEq/L (ref 19–32)
Calcium: 9.3 mg/dL (ref 8.4–10.5)
Chloride: 105 mEq/L (ref 96–112)
GFR: 78.55 mL/min (ref >60.00)
Potassium: 4.1 mEq/L (ref 3.5–5.1)

## 2011-02-26 NOTE — Assessment & Plan Note (Signed)
I will check her TSH and other labs to see if this is causing her fatigue

## 2011-02-26 NOTE — Progress Notes (Signed)
Addended by: Rock Nephew T on: 02/26/2011 10:04 AM   Modules accepted: Orders

## 2011-02-26 NOTE — Assessment & Plan Note (Signed)
I will recheck her LFT's

## 2011-02-26 NOTE — Progress Notes (Signed)
  Subjective:    Patient ID: Marilyn Dougherty, female    DOB: Mar 06, 1966, 45 y.o.   MRN: [id number]  Thyroid Problem Presents for follow-up visit. Symptoms include fatigue. Patient reports no anxiety, cold intolerance, constipation, depressed mood, diaphoresis, diarrhea, dry skin, hair loss, heat intolerance, hoarse voice, leg swelling, menstrual problem, nail problem, palpitations, tremors, visual change, weight gain or weight loss. The symptoms have been stable.      Review of Systems  Constitutional: Positive for fatigue. Negative for fever, chills, weight loss, weight gain, diaphoresis, activity change, appetite change and unexpected weight change.  HENT: Negative.  Negative for hoarse voice.   Eyes: Negative.   Respiratory: Negative.   Cardiovascular: Negative for chest pain, palpitations and leg swelling.  Gastrointestinal: Negative for nausea, vomiting, abdominal pain, diarrhea and constipation.  Genitourinary: Negative for dysuria, frequency, hematuria, decreased urine volume, enuresis, difficulty urinating, menstrual problem and dyspareunia.  Musculoskeletal: Negative for myalgias, back pain, joint swelling, arthralgias and gait problem.  Skin: Negative.   Neurological: Negative for dizziness, tremors, seizures, syncope, facial asymmetry, speech difficulty, weakness, light-headedness, numbness and headaches.  Hematological: Negative for cold intolerance, heat intolerance and adenopathy. Does not bruise/bleed easily.  Psychiatric/Behavioral: Negative.        Objective:   Physical Exam  Vitals reviewed. Constitutional: She is oriented to person, place, and time. She appears well-developed and well-nourished. No distress.  HENT:  Head: Normocephalic and atraumatic.  Mouth/Throat: Oropharynx is clear and moist. No oropharyngeal exudate.  Eyes: Conjunctivae are normal. Right eye exhibits no discharge. Left eye exhibits no discharge. No scleral icterus.  Neck: Normal range of motion.  Neck supple. No JVD present. No tracheal deviation present. No thyromegaly present.  Cardiovascular: Normal rate, regular rhythm, normal heart sounds and intact distal pulses.  Exam reveals no gallop and no friction rub.   No murmur heard. Pulmonary/Chest: Effort normal and breath sounds normal. No stridor. No respiratory distress. She has no wheezes. She has no rales. She exhibits no tenderness.  Abdominal: Soft. Bowel sounds are normal. She exhibits no distension and no mass. There is no tenderness. There is no rebound and no guarding.  Musculoskeletal: Normal range of motion. She exhibits no edema and no tenderness.  Lymphadenopathy:    She has no cervical adenopathy.  Neurological: She is oriented to person, place, and time. She displays normal reflexes. No cranial nerve deficit. She exhibits normal muscle tone. Coordination normal.  Skin: Skin is warm and dry. No rash noted. She is not diaphoretic. No erythema. No pallor.  Psychiatric: She has a normal mood and affect. Her behavior is normal. Judgment and thought content normal.      Lab Results  Component Value Date   WBC 7.7 07/10/2010   HGB 13.2 07/10/2010   HCT 38.7 07/10/2010   PLT 273.0 07/10/2010   GLUCOSE 90 05/19/2008   CHOL 161 07/10/2010   TRIG 73.0 07/10/2010   HDL 32.60* 07/10/2010   LDLCALC 114* 07/10/2010   ALT 45* 05/19/2008   AST 29 05/19/2008   NA 137 05/19/2008   K 3.9 05/19/2008   CL 101 05/19/2008   CREATININE 0.8 05/19/2008   BUN 11 05/19/2008   CO2 32 05/19/2008   TSH 1.89 07/10/2010   INR 1.1 RATIO* 05/05/2008      Assessment & Plan:

## 2011-03-04 ENCOUNTER — Encounter: Payer: Self-pay | Admitting: Internal Medicine

## 2011-05-30 ENCOUNTER — Other Ambulatory Visit: Payer: Self-pay | Admitting: Endocrinology

## 2011-07-01 ENCOUNTER — Ambulatory Visit (INDEPENDENT_AMBULATORY_CARE_PROVIDER_SITE_OTHER): Payer: 59 | Admitting: Internal Medicine

## 2011-07-01 ENCOUNTER — Encounter: Payer: Self-pay | Admitting: Internal Medicine

## 2011-07-01 ENCOUNTER — Other Ambulatory Visit (INDEPENDENT_AMBULATORY_CARE_PROVIDER_SITE_OTHER): Payer: 59

## 2011-07-01 DIAGNOSIS — E039 Hypothyroidism, unspecified: Secondary | ICD-10-CM

## 2011-07-01 DIAGNOSIS — E559 Vitamin D deficiency, unspecified: Secondary | ICD-10-CM

## 2011-07-01 DIAGNOSIS — Z720 Tobacco use: Secondary | ICD-10-CM

## 2011-07-01 DIAGNOSIS — J019 Acute sinusitis, unspecified: Secondary | ICD-10-CM

## 2011-07-01 DIAGNOSIS — K754 Autoimmune hepatitis: Secondary | ICD-10-CM

## 2011-07-01 DIAGNOSIS — F17200 Nicotine dependence, unspecified, uncomplicated: Secondary | ICD-10-CM

## 2011-07-01 DIAGNOSIS — K219 Gastro-esophageal reflux disease without esophagitis: Secondary | ICD-10-CM

## 2011-07-01 LAB — CBC WITH DIFFERENTIAL/PLATELET
Basophils Relative: 0.9 % (ref 0.0–3.0)
Eosinophils Relative: 3.8 % (ref 0.0–5.0)
HCT: 39 % (ref 36.0–46.0)
Hemoglobin: 13 g/dL (ref 12.0–15.0)
Lymphs Abs: 1.5 10*3/uL (ref 0.7–4.0)
MCV: 88 fl (ref 78.0–100.0)
Monocytes Absolute: 0.5 10*3/uL (ref 0.1–1.0)
Monocytes Relative: 6 % (ref 3.0–12.0)
Neutro Abs: 6.3 10*3/uL (ref 1.4–7.7)
Platelets: 297 10*3/uL (ref 150.0–400.0)
RBC: 4.44 Mil/uL (ref 3.87–5.11)
WBC: 8.7 10*3/uL (ref 4.5–10.5)

## 2011-07-01 LAB — LIPID PANEL: Cholesterol: 150 mg/dL (ref 0–200)

## 2011-07-01 LAB — COMPREHENSIVE METABOLIC PANEL
Albumin: 3.7 g/dL (ref 3.5–5.2)
BUN: 16 mg/dL (ref 6–23)
CO2: 28 mEq/L (ref 19–32)
GFR: 79.52 mL/min (ref >60.00)
Glucose, Bld: 89 mg/dL (ref 70–99)
Sodium: 138 mEq/L (ref 135–145)
Total Bilirubin: 0.5 mg/dL (ref 0.3–1.2)
Total Protein: 6.6 g/dL (ref 6.0–8.3)

## 2011-07-01 MED ORDER — AMOXICILLIN-POT CLAVULANATE 875-125 MG PO TABS: 1.0000 | ORAL_TABLET | Freq: Two times a day (BID) | ORAL | Status: AC

## 2011-07-01 NOTE — Assessment & Plan Note (Signed)
She has no s/s, I will check her LFT's today

## 2011-07-01 NOTE — Assessment & Plan Note (Signed)
She has agreed to quit smoking and tells me that she does not want to use chantix, instead she will try "cold [place]" with nicotine gum

## 2011-07-01 NOTE — Patient Instructions (Signed)
Hypothyroidism The thyroid is a large gland located in the lower front of your neck. The thyroid gland helps control metabolism. Metabolism is how your body handles food. It controls metabolism with the hormone thyroxine. When this gland is underactive (hypothyroid), it produces too little hormone.  CAUSES These include:   Absence or destruction of thyroid tissue.   Goiter due to iodine deficiency.   Goiter due to medications.   Congenital defects (since birth).   Problems with the pituitary. This causes a lack of TSH (thyroid stimulating hormone). This hormone tells the thyroid to turn out more hormone.  SYMPTOMS  Lethargy (feeling as though you have no energy)   Cold intolerance   Weight gain (in spite of normal food intake)   Dry skin   Coarse hair   Menstrual irregularity (if severe, may lead to infertility)   Slowing of thought processes  Cardiac problems are also caused by insufficient amounts of thyroid hormone. Hypothyroidism in the newborn is cretinism, and is an extreme form. It is important that this form be treated adequately and immediately or it will lead rapidly to retarded physical and mental development. DIAGNOSIS  To prove hypothyroidism, your caregiver may do blood tests and ultrasound tests. Sometimes the signs are hidden. It may be necessary for your caregiver to watch this illness with blood tests either before or after diagnosis and treatment. TREATMENT  Low levels of thyroid hormone are increased by using synthetic thyroid hormone. This is a safe, effective treatment. It usually takes about four weeks to gain the full effects of the medication. After you have the full effect of the medication, it will generally take another four weeks for problems to leave. Your caregiver may start you on low doses. If you have had heart problems the dose may be gradually increased. It is generally not an emergency to get rapidly to normal. HOME CARE INSTRUCTIONS   Take  your medications as your caregiver suggests. Let your caregiver know of any medications you are taking or start taking. Your caregiver will help you with dosage schedules.   As your condition improves, your dosage needs may increase. It will be necessary to have continuing blood tests as suggested by your caregiver.   Report all suspected medication side effects to your caregiver.  SEEK MEDICAL CARE IF: Seek medical care if you develop:  Sweating.   Tremulousness (tremors).   Anxiety.   Rapid weight loss.   Heat intolerance.   Emotional swings.   Diarrhea.   Weakness.  SEEK IMMEDIATE MEDICAL CARE IF:  You develop chest pain, an irregular heart beat (palpitations), or a rapid heart beat. MAKE SURE YOU:   Understand these instructions.   Will watch your condition.   Will get help right away if you are not doing well or get worse.  Document Released: 04/08/2005 Document Revised: 03/28/2011 Document Reviewed: 11/27/2007 [facility] Patient Information 2012 [place], [place].Sinusitis Sinuses are air pockets within the bones of your face. The growth of bacteria within a sinus leads to infection. The infection prevents the sinuses from draining. This infection is called sinusitis. SYMPTOMS  There will be different areas of pain depending on which sinuses have become infected.  The maxillary sinuses often produce pain beneath the eyes.   Frontal sinusitis may cause pain in the middle of the forehead and above the eyes.  Other problems (symptoms) include:  Toothaches.   Colored, pus-like (purulent) drainage from the nose.   Swelling, warmth, and tenderness over the sinus areas may be  signs of infection.  TREATMENT  Sinusitis is most often determined by an exam.X-rays may be taken. If x-rays have been taken, make sure you obtain your results or find out how you are to obtain them. Your caregiver may give you medications (antibiotics). These are medications that will help kill the  bacteria causing the infection. You may also be given a medication (decongestant) that helps to reduce sinus swelling.  HOME CARE INSTRUCTIONS   Only take over-the-counter or prescription medicines for pain, discomfort, or fever as directed by your caregiver.   Drink extra fluids. Fluids help thin the mucus so your sinuses can drain more easily.   Applying either moist heat or ice packs to the sinus areas may help relieve discomfort.   Use saline nasal sprays to help moisten your sinuses. The sprays can be found at your local drugstore.  SEEK IMMEDIATE MEDICAL CARE IF:  You have a fever.   You have increasing pain, severe headaches, or toothache.   You have nausea, vomiting, or drowsiness.   You develop unusual swelling around the face or trouble seeing.  MAKE SURE YOU:   Understand these instructions.   Will watch your condition.   Will get help right away if you are not doing well or get worse.  Document Released: 04/08/2005 Document Revised: 03/28/2011 Document Reviewed: 11/05/2006 [facility] Patient Information 2012 [place], [place].

## 2011-07-01 NOTE — Assessment & Plan Note (Signed)
Start augmentin for the infection 

## 2011-07-01 NOTE — Progress Notes (Signed)
Subjective:    Patient ID: Marilyn Dougherty, female    DOB: 07-Oct-1965, 46 y.o.   MRN: [id number]  Thyroid Problem Presents for follow-up visit. Symptoms include fatigue. Patient reports no anxiety, cold intolerance, constipation, depressed mood, diaphoresis, diarrhea, dry skin, hair loss, heat intolerance, hoarse voice, leg swelling, menstrual problem, nail problem, palpitations, tremors, visual change, weight gain or weight loss. The symptoms have been stable.  Sinusitis This is a new problem. The current episode started in the past 7 days. The problem has been gradually worsening since onset. There has been no fever. The fever has been present for less than 1 day. Her pain is at a severity of 0/10. She is experiencing no pain. Associated symptoms include chills, congestion, sinus pressure, sneezing and a sore throat. Pertinent negatives include no coughing, diaphoresis, ear pain, headaches, hoarse voice, neck pain, shortness of breath or swollen glands. Past treatments include nothing.      Review of Systems  Constitutional: Positive for chills, fatigue and unexpected weight change (weight gain). Negative for fever, weight loss, weight gain, diaphoresis, activity change and appetite change.  HENT: Positive for congestion, sore throat, rhinorrhea, sneezing, postnasal drip and sinus pressure. Negative for hearing loss, ear pain, nosebleeds, hoarse voice, facial swelling, drooling, mouth sores, trouble swallowing, neck pain, neck stiffness, dental problem, voice change, tinnitus and ear discharge.   Eyes: Negative.   Respiratory: Negative for apnea, cough, choking, chest tightness, shortness of breath, wheezing and stridor.   Cardiovascular: Negative for chest pain, palpitations and leg swelling.  Gastrointestinal: Negative for nausea, abdominal pain, diarrhea, constipation, blood in stool, abdominal distention, anal bleeding and rectal pain.  Genitourinary: Negative for dysuria, urgency, frequency,  hematuria, flank pain, decreased urine volume, vaginal bleeding, vaginal discharge, enuresis, difficulty urinating, genital sores, vaginal pain, menstrual problem, pelvic pain and dyspareunia.  Musculoskeletal: Negative for myalgias, back pain, joint swelling, arthralgias and gait problem.  Skin: Negative for color change, pallor, rash and wound.  Neurological: Negative for dizziness, tremors, seizures, syncope, facial asymmetry, weakness, light-headedness, numbness and headaches.  Hematological: Negative for cold intolerance, heat intolerance and adenopathy. Does not bruise/bleed easily.  Psychiatric/Behavioral: Negative.        Objective:   Physical Exam  Vitals reviewed. Constitutional: She is oriented to person, place, and time. She appears well-developed and well-nourished.  HENT:  Head: No trismus in the jaw.  Right Ear: Hearing, tympanic membrane, external ear and ear canal normal.  Left Ear: Hearing, tympanic membrane, external ear and ear canal normal.  Nose: Mucosal edema and rhinorrhea present. No nose lacerations, sinus tenderness, nasal deformity, septal deviation or nasal septal hematoma. No epistaxis.  No foreign bodies. Right sinus exhibits maxillary sinus tenderness. Right sinus exhibits no frontal sinus tenderness. Left sinus exhibits maxillary sinus tenderness. Left sinus exhibits no frontal sinus tenderness.  Mouth/Throat: Oropharynx is clear and moist and mucous membranes are normal. Mucous membranes are not pale, not dry and not cyanotic. No uvula swelling. No oropharyngeal exudate, posterior oropharyngeal edema, posterior oropharyngeal erythema or tonsillar abscesses.  Eyes: Conjunctivae are normal. Right eye exhibits no discharge. Left eye exhibits no discharge. No scleral icterus.  Neck: Normal range of motion. Neck supple. No JVD present. No tracheal deviation present. No thyromegaly present.  Cardiovascular: Normal rate, regular rhythm, normal heart sounds and intact  distal pulses.  Exam reveals no gallop and no friction rub.   No murmur heard. Pulmonary/Chest: Effort normal and breath sounds normal. No stridor. No respiratory distress. She has no wheezes. She  has no rales. She exhibits no tenderness.  Abdominal: Soft. Bowel sounds are normal. She exhibits no distension and no mass. There is no tenderness. There is no rebound and no guarding.  Musculoskeletal: Normal range of motion. She exhibits no edema and no tenderness.  Lymphadenopathy:    She has no cervical adenopathy.  Neurological: She is oriented to person, place, and time.  Skin: Skin is warm and dry. No rash noted. She is not diaphoretic. No erythema. No pallor.  Psychiatric: She has a normal mood and affect. Her behavior is normal. Judgment and thought content normal.     Lab Results  Component Value Date   WBC 7.5 02/26/2011   HGB 13.6 02/26/2011   HCT 41.1 02/26/2011   PLT 318.0 02/26/2011   GLUCOSE 86 02/26/2011   CHOL 170 02/26/2011   TRIG 84.0 02/26/2011   HDL 38.60* 02/26/2011   LDLCALC 115* 02/26/2011   ALT 20 02/26/2011   AST 15 02/26/2011   NA 140 02/26/2011   K 4.1 02/26/2011   CL 105 02/26/2011   CREATININE 0.8 02/26/2011   BUN 16 02/26/2011   CO2 29 02/26/2011   TSH 1.69 02/26/2011   INR 1.1 RATIO* 05/05/2008       Assessment & Plan:

## 2011-07-01 NOTE — Assessment & Plan Note (Signed)
Check her TSH today 

## 2011-07-01 NOTE — Assessment & Plan Note (Signed)
Vit D level today 

## 2011-07-02 LAB — T4: T4, Total: 5.9 ug/dL (ref 5.0–12.5)

## 2011-07-03 ENCOUNTER — Encounter: Payer: Self-pay | Admitting: Internal Medicine

## 2011-07-03 LAB — VITAMIN D 1,25 DIHYDROXY
Vitamin D 1, 25 (OH)2 Total: 30 pg/mL (ref 18–72)
Vitamin D3 1, 25 (OH)2: 30 pg/mL

## 2011-07-26 ENCOUNTER — Other Ambulatory Visit: Payer: Self-pay | Admitting: Cardiovascular Disease

## 2011-08-21 ENCOUNTER — Telehealth: Payer: Self-pay | Admitting: Cardiovascular Disease

## 2011-08-21 NOTE — Telephone Encounter (Signed)
New Problem:     I called the patient and was unable to reach them. I left a message on their voicemail with my name, the reason I called, the name of his physician, and a number to call back to schedule their appointment. 

## 2011-08-30 ENCOUNTER — Encounter: Payer: Self-pay | Admitting: Endocrinology

## 2011-08-30 ENCOUNTER — Ambulatory Visit (INDEPENDENT_AMBULATORY_CARE_PROVIDER_SITE_OTHER): Payer: 59 | Admitting: Endocrinology

## 2011-08-30 VITALS — BP 122/82 | HR 84 | Temp 98.3°F | Ht 68.0 in | Wt 236.0 lb

## 2011-08-30 DIAGNOSIS — J069 Acute upper respiratory infection, unspecified: Secondary | ICD-10-CM

## 2011-08-30 MED ORDER — CEFUROXIME AXETIL 250 MG PO TABS: 250.0000 mg | ORAL_TABLET | Freq: Two times a day (BID) | ORAL | Status: AC

## 2011-08-30 MED ORDER — BENZONATATE 200 MG PO CAPS: 200.0000 mg | ORAL_CAPSULE | Freq: Three times a day (TID) | ORAL | Status: AC | PRN

## 2011-08-30 NOTE — Progress Notes (Signed)
  Subjective:    Patient ID: Marilyn Dougherty, female    DOB: 22-May-1965, 46 y.o.   MRN: [id number]  HPI Pt states 1 week of pain at the throat, and assoc nasal congestion.  She also has headache. Past Medical History  Diagnosis Date  . Unspecified essential hypertension   . Mitral valve disorders   . Mitral valve insufficiency and aortic valve insufficiency   . Thyroiditis, unspecified   . Acute sinusitis, unspecified   . Esophageal reflux   . Flatulence, eructation, and gas pain   . Nonspecific abnormal results of liver function study   . Vitamin d deficiency   . Autoimmune hepatitis   . Edema   . Unspecified hypothyroidism   . Anxiety disorder   . Gallstones     Past Surgical History  Procedure Date  . Cholecystectomy   . Abdominal hysterectomy   . Mitral valve surgery     in [place]    History   Social History  . Marital Status: Married    Spouse Name: N/A    Number of Children: 1  . Years of Education: N/A   Occupational History  . [employer]    Social History Main Topics  . Smoking status: Current Everyday Smoker -- 1.0 packs/day for 20 years    Types: Cigarettes  . Smokeless tobacco: Never Used  . Alcohol Use: No  . Drug Use: No  . Sexually Active: Not Currently   Other Topics Concern  . Not on file   Social History Narrative   Regular Exercise -  NO    Current Outpatient Prescriptions on File Prior to Visit  Medication Sig Dispense Refill  . atenolol (TENORMIN) 25 MG tablet take 1/2 tablet by mouth twice a day  30 tablet  11  . levothyroxine (SYNTHROID, LEVOTHROID) 25 MCG tablet take 1 tablet by mouth once daily  30 tablet  5  . Mometasone Furo-Formoterol Fum (DULERA) 100-5 MCG/ACT AERO Inhale 2 puffs into the lungs 2 (two) times daily.          Allergies  Allergen Reactions  . Zithromax (Azithromycin)     Family History  Problem Relation Age of Onset  . Colitis Other     Paternal Cousin x 3  . Heart disease Paternal Grandmother   . Liver disease  Mother   . Thyroid disease Mother   . Lung cancer Father     smoker  . Thyroid disease Sister     BP 122/82  Pulse 84  Temp(Src) 98.3 F (36.8 C) (Oral)  Ht 5\' 8"  (1.727 m)  Wt 236 lb (107.049 kg)  BMI 35.88 kg/m2  SpO2 95%    Review of Systems She has prod cough, but no fever.      Objective:   Physical Exam VITAL SIGNS:  See vs page GENERAL: no distress head: no deformity eyes: no periorbital swelling, no proptosis external nose and ears are normal mouth: no lesion seen Both tm's are red NECK: There is no palpable thyroid enlargement.  No thyroid nodule is palpable.  No palpable lymphadenopathy at the anterior neck. LUNGS:  Clear to auscultation       Assessment & Plan:  URI.  New

## 2011-08-30 NOTE — Patient Instructions (Addendum)
i have sent 2 prescriptions to your pharmacy: for an antibiotic, and cough medication. Loratadine-d (non-prescription) will help your congestion. I hope you feel better soon.  If you don't feel better by next week, please call back.

## 2012-02-13 ENCOUNTER — Encounter: Payer: Self-pay | Admitting: Cardiovascular Disease

## 2012-02-13 ENCOUNTER — Ambulatory Visit (INDEPENDENT_AMBULATORY_CARE_PROVIDER_SITE_OTHER): Payer: 59 | Admitting: Cardiovascular Disease

## 2012-02-13 VITALS — BP 121/87 | HR 72 | Wt 275.0 lb

## 2012-02-13 DIAGNOSIS — I080 Rheumatic disorders of both mitral and aortic valves: Secondary | ICD-10-CM

## 2012-02-13 DIAGNOSIS — E039 Hypothyroidism, unspecified: Secondary | ICD-10-CM

## 2012-02-13 DIAGNOSIS — I059 Rheumatic mitral valve disease, unspecified: Secondary | ICD-10-CM

## 2012-02-13 DIAGNOSIS — I341 Nonrheumatic mitral (valve) prolapse: Secondary | ICD-10-CM

## 2012-02-13 NOTE — Patient Instructions (Signed)
Your physician wants you to follow-up in:   YEAR WITH DR Haywood Filler will receive a reminder letter in the mail two months in advance. If you don't receive a letter, please call our office to schedule the follow-up appointment. Your physician recommends that you continue on your current medications as directed. Please refer to the Current Medication list given to you today.  Your physician has requested that you have an echocardiogram. Echocardiography is a painless test that uses sound waves to create images of your heart. It provides your doctor with information about the size and shape of your heart and how well your heart's chambers and valves are working. This procedure takes approximately one hour. There are no restrictions for this procedure. DX MR

## 2012-02-13 NOTE — Progress Notes (Signed)
Patient ID: TERI LEGACY, female   DOB: 1966-04-11, 46 y.o.   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 I reviewed her echo from today. She had restricted motion of the posteriror leaflet but the prolapse was difficult to visualize. I thought her MR was more moderate in nature. with normal LV cavity size and funciton. She is asymptomatic with no dyspnea, PND, orthopnea edema or palpitaitons.  She had significant thyroiditis in February recent T 4 ok   I told her that I thought she was stable and not in need of MVR at this point  She decided against breast reduction surgery.  Still working at [employer] in [place].  Has 28 yo daughter doing well  Husband stays at home   T4 normal 7 months ago    ROS: Denies fever, malais, weight loss, blurry vision, decreased visual acuity, cough, sputum, SOB, hemoptysis, pleuritic pain, palpitaitons, heartburn, abdominal pain, melena, lower extremity edema, claudication, or rash.  All other systems reviewed and negative  General: Affect appropriate Healthy:  appears stated age HEENT: normal Neck supple with no adenopathy JVP normal no bruits no thyromegaly Lungs clear with no wheezing and good diaphragmatic motion Heart:  S1/S2 no murmur, no rub, gallop or click PMI normal Abdomen: benighn, BS positve, no tenderness, no AAA no bruit.  No HSM or HJR Distal pulses intact with no bruits No edema Neuro non-focal Skin warm and dry No muscular weakness   Current Outpatient Prescriptions  Medication Sig Dispense Refill  . atenolol (TENORMIN) 25 MG tablet take 1/2 tablet by mouth twice a day  30 tablet  11  . levothyroxine (SYNTHROID, LEVOTHROID) 25 MCG tablet take 1 tablet by mouth once daily  30 tablet  5    Allergies  Zithromax  Electrocardiogram:  NSR rate 72 normal No LAE  Assessment and Plan

## 2012-02-13 NOTE — Assessment & Plan Note (Addendum)
F/U echo.  Signs of CHF discussed.  Importance of dental care discussed Need to monitor EF.  Doubt valve will be repairable Continue low dose beta blocker to maximize filling time

## 2012-02-13 NOTE — Assessment & Plan Note (Signed)
Stable continue replacement F/U Dr Everardo All

## 2012-02-19 ENCOUNTER — Ambulatory Visit (HOSPITAL_COMMUNITY): Payer: 59 | Attending: Cardiology | Admitting: Radiology

## 2012-02-19 DIAGNOSIS — I059 Rheumatic mitral valve disease, unspecified: Secondary | ICD-10-CM

## 2012-02-19 DIAGNOSIS — I341 Nonrheumatic mitral (valve) prolapse: Secondary | ICD-10-CM

## 2012-02-19 DIAGNOSIS — I369 Nonrheumatic tricuspid valve disorder, unspecified: Secondary | ICD-10-CM | POA: Insufficient documentation

## 2012-02-19 DIAGNOSIS — I10 Essential (primary) hypertension: Secondary | ICD-10-CM | POA: Insufficient documentation

## 2012-02-19 DIAGNOSIS — I052 Rheumatic mitral stenosis with insufficiency: Secondary | ICD-10-CM | POA: Insufficient documentation

## 2012-02-19 NOTE — Progress Notes (Signed)
Echocardiogram performed.  

## 2012-03-03 ENCOUNTER — Other Ambulatory Visit: Payer: Self-pay | Admitting: Internal Medicine

## 2012-10-10 ENCOUNTER — Other Ambulatory Visit: Payer: Self-pay | Admitting: Internal Medicine

## 2012-10-12 ENCOUNTER — Other Ambulatory Visit: Payer: Self-pay | Admitting: *Deleted

## 2012-10-13 MED ORDER — LEVOTHYROXINE SODIUM 25 MCG PO TABS: ORAL_TABLET | ORAL | Status: DC

## 2012-11-12 ENCOUNTER — Telehealth: Payer: Self-pay

## 2012-11-12 MED ORDER — ATENOLOL 25 MG PO TABS: ORAL_TABLET | ORAL | Status: DC

## 2012-11-12 NOTE — Telephone Encounter (Signed)
Pt husband  called to rqst refill for amlodipine.advised pt that we will give enough refills until his 01/14/13 visit

## 2013-02-16 ENCOUNTER — Other Ambulatory Visit: Payer: Self-pay

## 2013-02-16 MED ORDER — ATENOLOL 25 MG PO TABS: ORAL_TABLET | ORAL | Status: DC

## 2013-02-17 ENCOUNTER — Ambulatory Visit (INDEPENDENT_AMBULATORY_CARE_PROVIDER_SITE_OTHER): Payer: 59 | Admitting: Cardiovascular Disease

## 2013-02-17 ENCOUNTER — Encounter: Payer: Self-pay | Admitting: Cardiovascular Disease

## 2013-02-17 VITALS — BP 112/76 | HR 76 | Ht 68.0 in | Wt 233.0 lb

## 2013-02-17 DIAGNOSIS — E039 Hypothyroidism, unspecified: Secondary | ICD-10-CM

## 2013-02-17 DIAGNOSIS — I080 Rheumatic disorders of both mitral and aortic valves: Secondary | ICD-10-CM

## 2013-02-17 DIAGNOSIS — R0989 Other specified symptoms and signs involving the circulatory and respiratory systems: Secondary | ICD-10-CM | POA: Insufficient documentation

## 2013-02-17 NOTE — Assessment & Plan Note (Signed)
With prolapse more moderate Asymptomatic with good EF  F/U echo with starin imaging to assess contractility.

## 2013-02-17 NOTE — Patient Instructions (Addendum)
Your physician wants you to follow-up in: YEAR   WITH  DR Haywood Filler will receive a reminder letter in the mail two months in advance. If you don't receive a letter, please call our office to schedule the follow-up appointment.  Your physician has requested that you have an echocardiogram. Echocardiography is a painless test that uses sound waves to create images of your heart. It provides your doctor with information about the size and shape of your heart and how well your heart's chambers and valves are working. This procedure takes approximately one hour. There are no restrictions for this procedure.   Your physician has requested that you have a carotid duplex. This test is an ultrasound of the carotid arteries in your neck. It looks at blood flow through these arteries that supply the brain with blood. Allow one hour for this exam. There are no restrictions or special instructions.

## 2013-02-17 NOTE — Assessment & Plan Note (Signed)
Right bruit not related to MR murmur f/u carotid duplex

## 2013-02-17 NOTE — Progress Notes (Signed)
Patient ID: Marilyn Dougherty, female   DOB: 09/10/1965, 47 y.o.   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 I reviewed her echo from today. She had restricted motion of the posteriror leaflet but the prolapse was difficult to visualize. I thought her MR was more moderate in nature. with normal LV cavity size and funciton. She is asymptomatic with no dyspnea, PND, orthopnea edema or palpitaitons. She had significant thyroiditis in February recent T 4 ok I told her that I thought she was stable and not in need of MVR at this point She decided against breast reduction surgery. Still working at [employer] in [place]. Has 78 yo daughter doing well Husband stays at home  T4 normal 7 months ago    ROS: Denies fever, malais, weight loss, blurry vision, decreased visual acuity, cough, sputum, SOB, hemoptysis, pleuritic pain, palpitaitons, heartburn, abdominal pain, melena, lower extremity edema, claudication, or rash.  All other systems reviewed and negative  General: Affect appropriate Healthy:  appears stated age HEENT: normal Neck supple with no adenopathy JVP normal  Right bruits no thyromegaly Lungs clear with no wheezing and good diaphragmatic motion Heart:  S1/S2 MR murmur, no rub, gallop or click PMI normal Abdomen: benighn, BS positve, no tenderness, no AAA no bruit.  No HSM or HJR Distal pulses intact with no bruits No edema Neuro non-focal Skin warm and dry No muscular weakness   Current Outpatient Prescriptions  Medication Sig Dispense Refill  . atenolol (TENORMIN) 25 MG tablet take 1/2 tablet by mouth twice a day  30 tablet  2  . levothyroxine (SYNTHROID, LEVOTHROID) 25 MCG tablet take 1 tablet by mouth once daily  30 tablet  5   No current facility-administered medications for this visit.    Allergies  Zithromax  Electrocardiogram:  SR rate 76 LAE otherwise normal  Assessment and Plan

## 2013-02-17 NOTE — Assessment & Plan Note (Signed)
Labs with pirmary normal per patient Continue replacement

## 2013-03-02 ENCOUNTER — Ambulatory Visit (HOSPITAL_COMMUNITY): Payer: 59 | Attending: Cardiology

## 2013-03-02 ENCOUNTER — Encounter: Payer: Self-pay | Admitting: Cardiology

## 2013-03-02 DIAGNOSIS — F17200 Nicotine dependence, unspecified, uncomplicated: Secondary | ICD-10-CM | POA: Insufficient documentation

## 2013-03-02 DIAGNOSIS — R0989 Other specified symptoms and signs involving the circulatory and respiratory systems: Secondary | ICD-10-CM | POA: Insufficient documentation

## 2013-03-09 ENCOUNTER — Encounter: Payer: Self-pay | Admitting: Cardiology

## 2013-03-09 ENCOUNTER — Ambulatory Visit (HOSPITAL_COMMUNITY): Payer: 59 | Attending: Cardiology | Admitting: Radiology

## 2013-03-09 DIAGNOSIS — K219 Gastro-esophageal reflux disease without esophagitis: Secondary | ICD-10-CM | POA: Insufficient documentation

## 2013-03-09 DIAGNOSIS — I080 Rheumatic disorders of both mitral and aortic valves: Secondary | ICD-10-CM

## 2013-03-09 DIAGNOSIS — F17200 Nicotine dependence, unspecified, uncomplicated: Secondary | ICD-10-CM | POA: Insufficient documentation

## 2013-03-09 DIAGNOSIS — R0989 Other specified symptoms and signs involving the circulatory and respiratory systems: Secondary | ICD-10-CM | POA: Insufficient documentation

## 2013-03-09 DIAGNOSIS — E039 Hypothyroidism, unspecified: Secondary | ICD-10-CM | POA: Insufficient documentation

## 2013-03-09 DIAGNOSIS — I059 Rheumatic mitral valve disease, unspecified: Secondary | ICD-10-CM | POA: Insufficient documentation

## 2013-03-09 NOTE — Progress Notes (Signed)
Echocardiogram performed.  

## 2013-04-17 ENCOUNTER — Other Ambulatory Visit: Payer: Self-pay | Admitting: Internal Medicine

## 2013-05-14 ENCOUNTER — Other Ambulatory Visit: Payer: Self-pay | Admitting: *Deleted

## 2013-05-14 MED ORDER — ATENOLOL 25 MG PO TABS: ORAL_TABLET | ORAL | Status: DC

## 2013-06-02 ENCOUNTER — Other Ambulatory Visit: Payer: Self-pay | Admitting: Internal Medicine

## 2013-06-03 ENCOUNTER — Other Ambulatory Visit: Payer: Self-pay | Admitting: Internal Medicine

## 2013-11-26 ENCOUNTER — Other Ambulatory Visit: Payer: Self-pay

## 2013-11-26 MED ORDER — ATENOLOL 25 MG PO TABS: ORAL_TABLET | ORAL | Status: DC

## 2014-03-07 ENCOUNTER — Ambulatory Visit: Payer: 59 | Admitting: Cardiovascular Disease

## 2014-08-16 NOTE — Progress Notes (Signed)
Patient ID: Marilyn SkillernCindy M Dougherty, female   DOB: 06/30/1965, 49 y.o.   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 She had significant thyroiditis in February recent T 4 ok I told her that I thought she was stable and not in need of MVR at this point She decided against breast reduction surgery. Still working at [employer] [place]. Has 815 yo daughter doing well Husband stays at home   T4 normal 7 months ago  Primary in [place] with [facility]   Echo 02/27/13 Reviewed: mild MS moderate MR  Study Conclusions  - Left ventricle: The cavity size was normal. Wall thickness was normal. Systolic function was normal. The estimated ejection fraction was in the range of 55% to 60%. Wall motion was normal; there were no regional wall motion abnormalities. Doppler parameters are consistent with high ventricular filling pressure. - Mitral valve: The findings are consistent with mild stenosis. Moderate regurgitation. - Left atrium: The atrium was moderately dilated. Impressions:  - Normal LV function; thickened MV with restricted posterior leaflet and mild rheumatic appearance of anterior leaflet; mild MS by pressure half time and moderate MR.  Exercising Stopped smoking and looks great no dyspnea, palpitations or chest pain   ROS: Denies fever, malais, weight loss, blurry vision, decreased visual acuity, cough, sputum, SOB, hemoptysis, pleuritic pain, palpitaitons, heartburn, abdominal pain, melena, lower extremity edema, claudication, or rash.  All other systems reviewed and negative  General: Affect appropriate Healthy:  appears stated age HEENT: normal Neck supple with no adenopathy JVP normal  Right bruits no thyromegaly Lungs clear with no wheezing and good diaphragmatic motion Heart:  S1/S2 MR murmur, no rub, gallop or click PMI  normal Abdomen: benighn, BS positve, no tenderness, no AAA no bruit.  No HSM or HJR Distal pulses intact with no bruits No edema Neuro non-focal Skin warm and dry No muscular weakness   Current Outpatient Prescriptions  Medication Sig Dispense Refill  . atenolol (TENORMIN) 25 MG tablet take 1/2 tablet by mouth twice a day 30 tablet 0  . levothyroxine (SYNTHROID, LEVOTHROID) 25 MCG tablet take 1 tablet by mouth once daily 30 tablet 5   No current facility-administered medications for this visit.    Allergies  Zithromax  Electrocardiogram:   02/17/13  SR rate 76 LAE otherwise normal  08/17/14  SR rate 54 normal early repol  Assessment and Plan MVD:  F/u echo murmur hard to hear on atenolol to maximize diastolic filling time Thyroid:  On replacement TSH normal per primary in [place] Smoking:  Quit congratulated her on this normal lung exam

## 2014-08-17 ENCOUNTER — Encounter: Payer: Self-pay | Admitting: Cardiovascular Disease

## 2014-08-17 ENCOUNTER — Ambulatory Visit (INDEPENDENT_AMBULATORY_CARE_PROVIDER_SITE_OTHER): Payer: 59 | Admitting: Cardiovascular Disease

## 2014-08-17 VITALS — BP 106/66 | HR 54 | Ht 68.0 in | Wt 227.1 lb

## 2014-08-17 DIAGNOSIS — I059 Rheumatic mitral valve disease, unspecified: Secondary | ICD-10-CM | POA: Diagnosis not present

## 2014-08-17 NOTE — Addendum Note (Signed)
Addended by: Scherrie BatemanYORK, CHRISTINE E on: 08/17/2014 09:57 AM   Modules accepted: Orders, Level of Service

## 2014-08-17 NOTE — Patient Instructions (Addendum)
Medication Instructions:  NO CHANGES  Labwork:NONE  Testing/Procedures: Your physician has requested that you have an echocardiogram. Echocardiography is a painless test that uses sound waves to create images of your heart. It provides your doctor with information about the size and shape of your heart and how well your heart's chambers and valves are working. This procedure takes approximately one hour. There are no restrictions for this procedure.  Follow-Up: Your physician wants you to follow-up in: YEAR WITH DR NISHAN You will receive a reminder letter in the mail two months in advance. If you don't receive a letter, please call our office to schedule the follow-up appointment.  Any Other Special Instructions Will Be Listed Below (If Applicable).   

## 2014-08-30 ENCOUNTER — Ambulatory Visit (HOSPITAL_COMMUNITY): Payer: 59 | Attending: Cardiovascular Disease

## 2014-08-30 ENCOUNTER — Other Ambulatory Visit: Payer: Self-pay

## 2014-08-30 DIAGNOSIS — I340 Nonrheumatic mitral (valve) insufficiency: Secondary | ICD-10-CM | POA: Insufficient documentation

## 2014-08-30 DIAGNOSIS — I059 Rheumatic mitral valve disease, unspecified: Secondary | ICD-10-CM | POA: Diagnosis not present

## 2015-02-01 ENCOUNTER — Encounter: Payer: Self-pay | Admitting: Sports Medicine

## 2015-02-01 ENCOUNTER — Ambulatory Visit (INDEPENDENT_AMBULATORY_CARE_PROVIDER_SITE_OTHER): Payer: 59 | Admitting: Sports Medicine

## 2015-02-01 VITALS — BP 136/80 | HR 77 | Resp 12

## 2015-02-01 DIAGNOSIS — M79674 Pain in right toe(s): Secondary | ICD-10-CM

## 2015-02-01 DIAGNOSIS — L600 Ingrowing nail: Secondary | ICD-10-CM | POA: Diagnosis not present

## 2015-02-01 DIAGNOSIS — M79675 Pain in left toe(s): Secondary | ICD-10-CM | POA: Diagnosis not present

## 2015-02-01 NOTE — Progress Notes (Deleted)
   Subjective:    Patient ID: Marilyn Dougherty, female    DOB: 09/23/1965, 49 y.o.   MRN: [id number]  HPI Patient presents here with B/L great toes and left 2nd toe that is painful. She tried cutting out the corners of the big toes to help  but was unsuccessful  Review of Systems  All other systems reviewed and are negative.      Objective:   Physical Exam        Assessment & Plan:

## 2015-02-01 NOTE — Progress Notes (Signed)
Patient ID: Marilyn Dougherty, female   DOB: [birth year], 49 y.o.   MRN: [id number] Subjective: Marilyn Dougherty is a 49 y.o.  female patient presents to office today complaining of a painful incurvated, bilateral hallux nail borders. This has been present for 1 year. Patient has treated this by attempting to trim herself with no improvement. Patient denies fever/chills/nausea/vomitting/any other related constitutional symptoms at this time.  Patient Active Problem List   Diagnosis Date Noted  . Bruit 02/17/2013  . Acute sinusitis, unspecified 07/01/2011  . Tobacco abuse 07/01/2011  . Routine general medical examination at a health care facility 07/10/2010  . Allergic rhinitis due to other allergen 01/23/2010  . MENOPAUSE, SURGICAL 05/31/2009  . MITRAL INSUFFICIENCY 11/11/2008  . HYPOTHYROIDISM 08/24/2008  . GERD 06/24/2008  . Unspecified vitamin D deficiency 05/03/2008  . MITRAL VALVE PROLAPSE 05/03/2008  . Autoimmune hepatitis (HCC) 05/03/2008   Current Outpatient Prescriptions on File Prior to Visit  Medication Sig Dispense Refill  . atenolol (TENORMIN) 25 MG tablet take 1/2 tablet by mouth twice a day 30 tablet 0  . DULoxetine (CYMBALTA) 30 MG capsule Take 30 mg by mouth daily.    Marland Kitchen levothyroxine (SYNTHROID, LEVOTHROID) 25 MCG tablet take 1 tablet by mouth once daily 30 tablet 5   No current facility-administered medications on file prior to visit.   Allergies  Allergen Reactions  . Zithromax [Azithromycin]      Objective:  Vitals: Reviewed  General: Well developed, nourished, in no acute distress, alert and oriented x3   Dermatology: Skin is warm, dry and supple bilateral. Bilateral hallux nails appear to be  severely incurvated with hyperkeratosis formation at the distal aspects of  the medial and lateral nail borders. (-) Erythema. (-) Edema. (-) serosanguous  drainage present. The remaining nails appear mildy dystrophic and not ingrown at this time; currently using PO lamisil  rx by PCP who is monitoring LFTs. There are no open sores, lesions or other signs of infection  present.  Vascular: Dorsalis Pedis artery and Posterior Tibial artery pedal pulses are 2/4 bilateral with immedate capillary fill time. Pedal hair growth present. No lower extremity edema.   Neruologic: Grossly intact via light touch bilateral.  Musculoskeletal: Tenderness to palpation of the medial and lateral nail fold(s) at right and left hallux. Muscular strength within normal limits in all groups bilateral.   Assesement and Plan: Problem List Items Addressed This Visit    None    Visit Diagnoses    Ingrown nail    -  Primary    Bilateral hallux medial and lateral borders    Pain in toes of both feet          -Discussed treatment alternatives and plan of care; Explained permanent/temporary nail avulsion and post procedure course to patient. - After a verbal consent for bilateral hallux medial and lateral borders matrixectomy, injected 3 ml of a 50:50 mixture of 1% plain lidocaine and 0.25% plain marcaine in a normal hallux block fashion. Next, a  betadine prep was performed. Anesthesia was tested and found to be appropriate.  The offending medial and lateral nail border of both hallux was then incised from the hyponychium to the epinychium. The offending nail border was removed and cleared from the field. The area was curretted for any remaining nail or spicules. Phenol application performed and the area was then flushed with alcohol and dressed with silvadene cream and a dry sterile dressing. -Patient was instructed to leave the dressing intact for today and  begin soaking  in a weak solution of betadine and water tomorrow. Patient was instructed to  soak for 15 minutes each day and apply neosporin and a gauze or bandaid dressing each day. -Patient was instrcuted to monitor the toe for signs of infection and return to office if toe becomes red, hot or swollen. -Patient is to return in 1 week  for follow up care or sooner if problems arise.  Asencion Islamitorya Michelangelo Rindfleisch, DPM

## 2015-02-01 NOTE — Patient Instructions (Signed)

## 2015-02-08 ENCOUNTER — Ambulatory Visit: Payer: 59 | Admitting: Sports Medicine

## 2015-08-11 ENCOUNTER — Telehealth: Payer: Self-pay | Admitting: Cardiovascular Disease

## 2015-08-11 ENCOUNTER — Other Ambulatory Visit: Payer: Self-pay | Admitting: *Deleted

## 2015-08-11 MED ORDER — ATENOLOL 25 MG PO TABS: ORAL_TABLET | ORAL | Status: DC

## 2015-08-11 NOTE — Telephone Encounter (Signed)
°  New Prob    *STAT* If patient is at the pharmacy, call can be transferred to refill team.   1. Which medications need to be refilled? (please list name of each medication and dose if known) Atenolol 35 mg  2. Which pharmacy/location (including street and city if local pharmacy) is medication to be sent to? [facility] on [address] in [place]  3. Do they need a 30 day or 90 day supply? 90 day supply

## 2015-09-20 NOTE — Progress Notes (Signed)
Patient ID: Marilyn Dougherty, female   DOB: January 23, 1966, 50 y.o.   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 She had significant thyroiditis in February recent T 4 ok I told her that I thought she was stable and not in need of MVR at this point She decided against breast reduction surgery. Still working at [employer] and [employer] in [place]. Has 64 yo daughter doing well Marilyn Dougherty stays at home   T4 normal 7 months ago  Primary in [place] with [facility]   Echo 08/30/14 mild to moderate MR and mild MS  Study Conclusions  - Left ventricle: The cavity size was normal. Wall thickness was  normal. Systolic function was normal. The estimated ejection  fraction was in the range of 55% to 60%. Doppler parameters are  consistent with elevated ventricular end-diastolic filling  pressure. - Mitral valve: Restricted posterior leaflet motion. There was mild  to moderate regurgitation. Valve area by pressure half-time: 1.64  cm^2. Valve area by continuity equation (using LVOT flow): 0.82  cm^2. - Left atrium: The atrium was moderately dilated. - Atrial septum: No defect or patent foramen ovale was identified. - Pulmonary arteries: PA peak pressure: 44 mm Hg (S).  Exercising Stopped smoking and looks great no dyspnea, palpitations or chest pain  Traveling a lot doing QA for [employer]  LE edema  Has one daughter Marilyn Dougherty at [employer]   ROS: Denies fever, malais, weight loss, blurry vision, decreased visual acuity, cough, sputum, SOB, hemoptysis, pleuritic pain, palpitaitons, heartburn, abdominal pain, melena, lower extremity edema, claudication, or rash.  All other systems reviewed and negative  General: Affect appropriate Healthy:  appears stated age HEENT: normal Neck supple with no adenopathy JVP normal  Right bruits no thyromegaly Lungs clear  with no wheezing and good diaphragmatic motion Heart:  S1/S2 MR murmur, no rub, gallop or click PMI normal Abdomen: benighn, BS positve, no tenderness, no AAA no bruit.  No HSM or HJR Distal pulses intact with no bruits Plus one bilateral  edema Neuro non-focal Skin warm and dry No muscular weakness   Current Outpatient Prescriptions  Medication Sig Dispense Refill  . atenolol (TENORMIN) 25 MG tablet take 1/2 tablet by mouth twice a day 90 tablet 0  . DULoxetine (CYMBALTA) 30 MG capsule Take 30 mg by mouth daily.  0  . levothyroxine (SYNTHROID, LEVOTHROID) 25 MCG tablet take 1 tablet by mouth once daily 30 tablet 5  . TERBINAFINE HCL PO Take 250 mg by mouth daily.    Marland Kitchen tiZANidine (ZANAFLEX) 4 MG tablet Take 4 mg by mouth at bedtime.    . valACYclovir (VALTREX) 1000 MG tablet Take 1 g by mouth as needed.    . DULoxetine (CYMBALTA) 30 MG capsule Take 30 mg by mouth daily.    . hydrochlorothiazide (MICROZIDE) 12.5 MG capsule Take 1 capsule (12.5 mg total) by mouth daily. 90 capsule 3   No current facility-administered medications for this visit.    Allergies  Zithromax  Electrocardiogram:   02/17/13  SR rate 76 LAE otherwise normal  08/17/14  SR rate 54 normal early repol  09/22/15 SR rate 57 normal   Assessment and Plan MVD:  F/u echo murmur hard to hear on atenolol to maximize diastolic filling time Thyroid:  On replacement TSH normal per primary in [place] Smoking:  Quit congratulated her on this normal lung  exam Edema: dependant from obesity and travel PRN HCTZ called in    [employer] CorporationPeter Seamus Warehime

## 2015-09-22 ENCOUNTER — Encounter: Payer: Self-pay | Admitting: Cardiovascular Disease

## 2015-09-22 ENCOUNTER — Ambulatory Visit (INDEPENDENT_AMBULATORY_CARE_PROVIDER_SITE_OTHER): Payer: 59 | Admitting: Cardiovascular Disease

## 2015-09-22 VITALS — BP 138/84 | HR 66 | Resp 18 | Ht 68.0 in | Wt 274.0 lb

## 2015-09-22 DIAGNOSIS — I059 Rheumatic mitral valve disease, unspecified: Secondary | ICD-10-CM | POA: Diagnosis not present

## 2015-09-22 MED ORDER — HYDROCHLOROTHIAZIDE 12.5 MG PO CAPS: 12.5000 mg | ORAL_CAPSULE | Freq: Every day | ORAL | Status: DC

## 2015-09-22 NOTE — Patient Instructions (Addendum)
Medication Instructions:  Your physician has recommended you make the following change in your medication:  1-Hydrochlorothiazide  12.5 mg by mouth daily as needed.  Labwork: NONE  Testing/Procedures: Your physician has requested that you have an echocardiogram. Echocardiography is a painless test that uses sound waves to create images of your heart. It provides your doctor with information about the size and shape of your heart and how well your heart's chambers and valves are working. This procedure takes approximately one hour. There are no restrictions for this procedure.  Follow-Up: Your physician wants you to follow-up in: 12 months with Dr. Eden EmmsNishan. You will receive a reminder letter in the mail two months in advance. If you don't receive a letter, please call our office to schedule the follow-up appointment.   If you need a refill on your cardiac medications before your next appointment, please call your pharmacy.

## 2015-10-11 ENCOUNTER — Ambulatory Visit (HOSPITAL_COMMUNITY): Payer: 59 | Attending: Cardiovascular Disease

## 2015-11-14 ENCOUNTER — Ambulatory Visit (HOSPITAL_COMMUNITY): Payer: 59 | Attending: Cardiology

## 2015-11-14 ENCOUNTER — Other Ambulatory Visit (HOSPITAL_COMMUNITY): Payer: Self-pay

## 2015-11-14 DIAGNOSIS — I340 Nonrheumatic mitral (valve) insufficiency: Secondary | ICD-10-CM | POA: Diagnosis not present

## 2015-11-14 DIAGNOSIS — E669 Obesity, unspecified: Secondary | ICD-10-CM | POA: Diagnosis not present

## 2015-11-14 DIAGNOSIS — I119 Hypertensive heart disease without heart failure: Secondary | ICD-10-CM | POA: Diagnosis not present

## 2015-11-14 DIAGNOSIS — Z8249 Family history of ischemic heart disease and other diseases of the circulatory system: Secondary | ICD-10-CM | POA: Insufficient documentation

## 2015-11-14 DIAGNOSIS — Z6841 Body Mass Index (BMI) 40.0 and over, adult: Secondary | ICD-10-CM | POA: Insufficient documentation

## 2015-11-14 DIAGNOSIS — I050 Rheumatic mitral stenosis: Secondary | ICD-10-CM | POA: Diagnosis present

## 2015-11-14 DIAGNOSIS — Z87891 Personal history of nicotine dependence: Secondary | ICD-10-CM | POA: Insufficient documentation

## 2015-11-14 DIAGNOSIS — I059 Rheumatic mitral valve disease, unspecified: Secondary | ICD-10-CM | POA: Diagnosis not present

## 2015-11-16 ENCOUNTER — Telehealth: Payer: Self-pay

## 2015-11-16 DIAGNOSIS — I059 Rheumatic mitral valve disease, unspecified: Secondary | ICD-10-CM

## 2015-11-16 MED ORDER — ATENOLOL 25 MG PO TABS: ORAL_TABLET | ORAL | 3 refills | Status: DC

## 2015-11-16 NOTE — Telephone Encounter (Signed)
Called patient with her echo results. Patient requested refill for her atenolol. Sent refill to patient's pharmacy of choice. Will put in recall letter for follow-up echo in one year.

## 2015-11-16 NOTE — Telephone Encounter (Signed)
-----   Message from Wendall Stade, MD sent at 11/14/2015  4:59 PM EDT ----- Mild MS f/u echo in a year no change from previous

## 2016-05-21 ENCOUNTER — Encounter: Payer: Self-pay | Admitting: Cardiovascular Disease

## 2016-11-25 ENCOUNTER — Other Ambulatory Visit (HOSPITAL_COMMUNITY): Payer: 59

## 2016-12-02 ENCOUNTER — Other Ambulatory Visit (HOSPITAL_COMMUNITY): Payer: 59

## 2018-04-29 ENCOUNTER — Telehealth: Payer: Self-pay | Admitting: Internal Medicine

## 2018-04-29 NOTE — Telephone Encounter (Signed)
Yes, I will see her 

## 2018-04-29 NOTE — Telephone Encounter (Signed)
Copied from CRM [id number]. Topic: Appointment Scheduling - New Patient >> Apr 29, 2018 12:09 PM Wyonia Hough E wrote: A former Pt of Dr. Sanda Linger called in and would like to schedule a CPE / Pt was advised that she would need to have a new Patient appt and start over. Pt asked to see Dr. Yetta Barre and re-establish care with him. Pt was last seen by Dr. Yetta Barre in 2013. Pt also stated the pharmacy needs approval from her PCP for travel immunizations. New patient would like to be scheduled for your office./ please contact Pt and advise   Provider: Dr. Sanda Linger    Route to department's [facility] pool.

## 2018-05-04 NOTE — Telephone Encounter (Signed)
Patients spouse states patient just left for [place].  He will pass the info along for the patient to call once back to schedule appt.

## 2018-05-12 NOTE — Progress Notes (Signed)
Patient ID: Marilyn Dougherty, female   DOB: 11/11/1965, 53 y.o.   MRN: [id number]     53 y.o. f/u for mitral valve disease. Last TTE reviewed from 11/14/15 EF 60-65% mild to moderate MS trivial MR mean gradient 7 mmHg  Peak gradient 11 mmHg MVA PT1/2 1.93 Restricted posterior leaflet motion. 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  All other systems reviewed and negative  General: Affect appropriate Healthy:  appears stated age HEENT: normal Neck supple with no adenopathy JVP normal  Right bruits no thyromegaly Lungs clear with no wheezing and good diaphragmatic motion Heart:  S1/S2 MR murmur, no rub, gallop or click PMI normal Abdomen: benighn, BS positve, no tenderness, no AAA no bruit.  No HSM or HJR Distal pulses intact with no bruits Plus one bilateral  edema Neuro non-focal Skin warm and dry No muscular weakness   Current Outpatient Medications  Medication Sig Dispense Refill  . DULoxetine (CYMBALTA) 30 MG capsule Take 30 mg by mouth daily.  0  . tiZANidine (ZANAFLEX) 4 MG tablet Take 4 mg by mouth at bedtime.    . valACYclovir (VALTREX) 1000 MG tablet Take 1 g by mouth as needed.     No current facility-administered medications for this visit.     Allergies  Zithromax [azithromycin]  Electrocardiogram:   05/15/18 SR rate 68 normal   Assessment and Plan MVD:  F/u echo murmur not heard given tremendous weight loss no  Need for beta blocker or diuretic at this time  Thyroid:  On replacement TSH normal per primary in [place] Smoking:  Quit  congratulated her on this normal lung exam Edema: resolved after bariatric surgery    Charlton HawsPeter 

## 2018-05-14 ENCOUNTER — Ambulatory Visit: Payer: 59 | Admitting: Cardiovascular Disease

## 2018-05-15 ENCOUNTER — Encounter: Payer: Self-pay | Admitting: Cardiovascular Disease

## 2018-05-15 ENCOUNTER — Ambulatory Visit: Payer: 59 | Admitting: Cardiovascular Disease

## 2018-05-15 VITALS — BP 104/62 | HR 68 | Ht 68.0 in | Wt 142.8 lb

## 2018-05-15 DIAGNOSIS — I059 Rheumatic mitral valve disease, unspecified: Secondary | ICD-10-CM

## 2018-05-15 DIAGNOSIS — I050 Rheumatic mitral stenosis: Secondary | ICD-10-CM

## 2018-05-15 NOTE — Patient Instructions (Addendum)
Medication Instructions:   If you need a refill on your cardiac medications before your next appointment, please call your pharmacy.   Lab work:  If you have labs (blood work) drawn today and your tests are completely normal, you will receive your results only by: . MyChart Message (if you have MyChart) OR . A paper copy in the mail If you have any lab test that is abnormal or we need to change your treatment, we will call you to review the results.  Testing/Procedures: Your physician has requested that you have an echocardiogram. Echocardiography is a painless test that uses sound waves to create images of your heart. It provides your doctor with information about the size and shape of your heart and how well your heart's chambers and valves are working. This procedure takes approximately one hour. There are no restrictions for this procedure.   Follow-Up: At [facility], you and your health needs are our priority.  As part of our continuing mission to provide you with exceptional heart care, we have created designated Provider Care Teams.  These Care Teams include your primary Cardiologist (physician) and Advanced Practice Providers (APPs -  Physician Assistants and Nurse Practitioners) who all work together to provide you with the care you need, when you need it. You will need a follow up appointment in 12 months.  Please call our office 2 months in advance to schedule this appointment.  You may see Dr. Nishan or one of the following Advanced Practice Providers on your designated Care Team:   Lori Gerhardt, NP Laura Ingold, NP . Jill McDaniel, NP    

## 2018-06-08 ENCOUNTER — Other Ambulatory Visit (HOSPITAL_COMMUNITY): Payer: 59

## 2018-06-19 ENCOUNTER — Encounter: Payer: Self-pay | Admitting: Cardiovascular Disease

## 2018-06-26 ENCOUNTER — Ambulatory Visit (HOSPITAL_COMMUNITY): Payer: 59 | Attending: Internal Medicine

## 2018-06-26 DIAGNOSIS — I050 Rheumatic mitral stenosis: Secondary | ICD-10-CM | POA: Insufficient documentation

## 2018-06-26 DIAGNOSIS — I059 Rheumatic mitral valve disease, unspecified: Secondary | ICD-10-CM | POA: Insufficient documentation

## 2018-07-02 ENCOUNTER — Telehealth: Payer: Self-pay

## 2018-07-02 DIAGNOSIS — I050 Rheumatic mitral stenosis: Secondary | ICD-10-CM

## 2018-07-02 DIAGNOSIS — I052 Rheumatic mitral stenosis with insufficiency: Secondary | ICD-10-CM

## 2018-07-02 NOTE — Telephone Encounter (Signed)
Patient's husband (DPR) is aware of results. Per Dr. Eden Emms, Mild MR/MS EF normal f/u echo in a year. Patient's husband verbalized understanding and will have patient call if she has any questions. Placed order for repeat echo in one year.

## 2018-07-02 NOTE — Telephone Encounter (Signed)
-----   Message from Wendall Stade, MD sent at 06/26/2018  2:52 PM EST ----- Mild MR/MS EF normal f/u echo in a year

## 2018-07-04 ENCOUNTER — Encounter (HOSPITAL_COMMUNITY): Payer: Self-pay

## 2018-07-04 ENCOUNTER — Ambulatory Visit (HOSPITAL_COMMUNITY)
Admission: EM | Admit: 2018-07-04 | Discharge: 2018-07-04 | Disposition: A | Discharge status: Home / Self Care | Payer: 59 | Attending: Family Medicine | Admitting: Family Medicine

## 2018-07-04 DIAGNOSIS — R0981 Nasal congestion: Secondary | ICD-10-CM | POA: Diagnosis not present

## 2018-07-04 DIAGNOSIS — J069 Acute upper respiratory infection, unspecified: Secondary | ICD-10-CM

## 2018-07-04 DIAGNOSIS — B9789 Other viral agents as the cause of diseases classified elsewhere: Secondary | ICD-10-CM

## 2018-07-04 MED ORDER — BENZONATATE 100 MG PO CAPS: 100.0000 mg | ORAL_CAPSULE | Freq: Three times a day (TID) | ORAL | 0 refills | Status: AC | PRN

## 2018-07-04 MED ORDER — PREDNISONE 10 MG PO TABS: 20.0000 mg | ORAL_TABLET | Freq: Every day | ORAL | 0 refills | Status: AC

## 2018-07-04 MED ORDER — IPRATROPIUM BROMIDE 0.03 % NA SOLN: 2.0000 | Freq: Two times a day (BID) | NASAL | 0 refills | Status: AC

## 2018-07-04 NOTE — ED Triage Notes (Signed)
Pt present sore throat, nasal drainage with congestion. Symptoms started yesterday.  Pt has tried otc medication

## 2018-07-04 NOTE — ED Provider Notes (Signed)
[facility]    CSN: [id number] Arrival date & time: 07/04/18  1602     History   Chief Complaint Chief Complaint  Patient presents with  . Sore Throat  . Nasal Congestion    HPI Marilyn Dougherty is a 53 y.o. female.   HPI   Patient presents with a complaint of headache, sore throat and nasal congestion x 1 day. She is afebrile and concern for flu due to several co-workers tested positive for flu. She has taken one dose of OTC multi-symptoms medication which improved symptoms. She endorses shortness of breath and chest tightness. Denies wheezing. No history of asthma. She is a non-smoker and no history of pneumonia. Past Medical History:  Diagnosis Date  . Acute sinusitis, unspecified   . Anxiety disorder   . Autoimmune hepatitis (HCC)   . Edema   . Esophageal reflux   . Flatulence, eructation, and gas pain   . Gallstones   . Mitral valve disorders(424.0)   . Mitral valve insufficiency and aortic valve insufficiency   . Nonspecific abnormal results of liver function study   . Thyroiditis, unspecified   . Unspecified essential hypertension   . Unspecified hypothyroidism   . Vitamin D deficiency     Patient Active Problem List   Diagnosis Date Noted  . Bruit 02/17/2013  . Acute sinusitis, unspecified 07/01/2011  . Tobacco abuse 07/01/2011  . Routine general medical examination at a health care facility 07/10/2010  . Allergic rhinitis due to other allergen 01/23/2010  . MENOPAUSE, SURGICAL 05/31/2009  . MITRAL INSUFFICIENCY 11/11/2008  . HYPOTHYROIDISM 08/24/2008  . GERD 06/24/2008  . Unspecified vitamin D deficiency 05/03/2008  . Mitral valve disorder 05/03/2008  . Autoimmune hepatitis (HCC) 05/03/2008    Past Surgical History:  Procedure Laterality Date  . ABDOMINAL HYSTERECTOMY    . CHOLECYSTECTOMY    . MITRAL VALVE SURGERY     in [place]    OB History   No obstetric history on file.      Home Medications    Prior to Admission medications    Medication Sig Start Date End Date Taking? Authorizing Provider  DULoxetine (CYMBALTA) 30 MG capsule Take 30 mg by mouth daily. 08/08/15   [provider]  tiZANidine (ZANAFLEX) 4 MG tablet Take 4 mg by mouth at bedtime. 09/11/15   [provider]  valACYclovir (VALTREX) 1000 MG tablet Take 1 g by mouth as needed. 11/08/14   [provider]    Family History Family History  Problem Relation Age of Onset  . Colitis Other        Paternal Cousin x 3  . Heart disease Paternal Grandmother   . Liver disease Mother   . Thyroid disease Mother   . Lung cancer Father        smoker  . Thyroid disease Sister     Social History Social History   Tobacco Use  . Smoking status: Former Smoker    Packs/day: 1.00    Years: 20.00    Pack years: 20.00    Types: Cigarettes  . Smokeless tobacco: Never Used  Substance Use Topics  . Alcohol use: No    Alcohol/week: 0.0 standard drinks  . Drug use: No     Allergies   Zithromax [azithromycin]   Review of Systems Review of Systems Pertinent negatives listed in HPI  Physical Exam Triage Vital Signs ED Triage Vitals  Enc Vitals Group     BP 07/04/18 1716 (!) 111/92  Pulse Rate 07/04/18 1716 60     Resp 07/04/18 1716 18     Temp 07/04/18 1716 98.3 F (36.8 C)     Temp Source 07/04/18 1716 Oral     SpO2 07/04/18 1716 100 %     Weight --      Height --      Head Circumference --      Peak Flow --      Pain Score 07/04/18 1717 8     Pain Loc --      Pain Edu? --      Excl. in GC? --    No data found.  Updated Vital Signs BP (!) 111/92 (BP Location: Right Arm)   Pulse 60   Temp 98.3 F (36.8 C) (Oral)   Resp 18   SpO2 100%   Visual Acuity Right Eye Distance:   Left Eye Distance:   Bilateral Distance:    Right Eye Near:   Left Eye Near:    Bilateral Near:     Physical Exam HENT:     Head: Normocephalic.     Nose: Congestion and rhinorrhea present.     Mouth/Throat:     Mouth: Mucous  membranes are dry.     Pharynx: Posterior oropharyngeal erythema present. No oropharyngeal exudate.  Eyes:     Pupils: Pupils are equal, round, and reactive to light.  Neck:     Musculoskeletal: Normal range of motion.  Cardiovascular:     Rate and Rhythm: Normal rate and regular rhythm.  Pulmonary:     Effort: Pulmonary effort is normal. No respiratory distress.     Breath sounds: Normal breath sounds.  Musculoskeletal: Normal range of motion.  Skin:    General: Skin is warm and dry.  Neurological:     Mental Status: She is alert and oriented to person, place, and time.      UC Treatments / Results  Labs (all labs ordered are listed, but only abnormal results are displayed) Labs Reviewed - No data to display  EKG None  Radiology No results found.  Procedures Procedures (including critical care time)  Medications Ordered in UC Medications - No data to display  Initial Impression / Assessment and Plan / UC Course  I have reviewed the triage vital signs and the nursing notes.  Pertinent labs & imaging results that were available during my care of the patient were reviewed by me and considered in my medical decision making (see chart for details).   Well-appearing female presents with concern of influenza. She is afebrile and asymptomatic of flu like symptoms. Given limitability of flu testing kits, will opt against flu testing. Reassurance given to patient and symptomatic treatment prescribed. Patient is advised to follow-up PCP if symptoms do not improve with treatment. Final Clinical Impressions(s) / UC Diagnoses   Final diagnoses:  Nasal congestion  Viral URI with cough   Discharge Instructions   None    ED Prescriptions    Medication Sig Dispense Auth. Provider   predniSONE (DELTASONE) 10 MG tablet Take 2 tablets (20 mg total) by mouth daily with breakfast for 5 days. 10 tablet Bing Neighbors, FNP   ipratropium (ATROVENT) 0.03 % nasal spray Place 2 sprays  into both nostrils 2 (two) times daily. 30 mL Bing Neighbors, FNP   benzonatate (TESSALON) 100 MG capsule Take 1-2 capsules (100-200 mg total) by mouth 3 (three) times daily as needed for cough. 40 capsule Bing Neighbors, FNP  Controlled Substance Prescriptions [place] Controlled Substance Registry consulted? Not Applicable   Bing Neighbors, FNP 07/06/18 2046

## 2018-07-16 ENCOUNTER — Other Ambulatory Visit: Payer: Self-pay | Admitting: Family Medicine

## 2019-10-25 NOTE — Progress Notes (Signed)
Patient ID: Marilyn Dougherty, female   DOB: [birth year], 54 y.o.   MRN: [id number]     54 y.o. f/u for mitral valve disease. 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  All other systems reviewed and negative  General: Affect appropriate Healthy:  appears stated age HEENT: normal Neck supple with no adenopathy JVP normal  Right bruits no thyromegaly Lungs clear with no wheezing and good diaphragmatic motion Heart:  S1/S2 SEM 2/6  murmur, no rub, gallop or click PMI normal Abdomen: benighn, BS positve, no tenderness, no AAA no bruit.  No HSM or HJR Distal pulses intact with no bruits Plus one bilateral  edema Neuro non-focal Skin warm and dry No muscular weakness   Current Outpatient Medications  Medication Sig Dispense Refill  . benzonatate (TESSALON) 100 MG capsule Take 1-2 capsules (100-200 mg total) by mouth 3 (three) times daily as needed for cough. 40 capsule 0  . DULoxetine (CYMBALTA) 30 MG capsule Take 30 mg by mouth daily.  0  . ipratropium (ATROVENT) 0.03 % nasal spray Place 2 sprays into both nostrils 2 (two) times daily. 30 mL 0  . tiZANidine (ZANAFLEX) 4 MG tablet Take 4 mg by mouth at bedtime.    . valACYclovir (VALTREX) 1000 MG tablet Take 1 g by mouth as needed.    . zolpidem (AMBIEN CR) 12.5 MG CR tablet Take 1 tablet by mouth at bedtime.     . calcium citrate-vitamin D (CITRACAL+D) 315-200 MG-UNIT tablet Take 1 tablet by mouth daily.     No current facility-administered medications for this visit.    Allergies  Zithromax [azithromycin]  Electrocardiogram:   05/15/18 SR rate 68 normal 10/26/19 SR rate 52 normal   Assessment and Plan MVD:  Mild MS f/u echo ordered soft murmur no symptoms  Thyroid:  On replacement TSH normal per primary in [place] Smoking:  Quit congratulated her on this normal lung exam Edema: resolved after bariatric surgery  Depression: on cymbalta stable    [employer]

## 2019-10-26 ENCOUNTER — Encounter: Payer: Self-pay | Admitting: Cardiovascular Disease

## 2019-10-26 ENCOUNTER — Ambulatory Visit: Payer: 59 | Admitting: Cardiovascular Disease

## 2019-10-26 ENCOUNTER — Other Ambulatory Visit: Payer: Self-pay

## 2019-10-26 VITALS — BP 112/76 | HR 52 | Ht 68.0 in | Wt 147.0 lb

## 2019-10-26 DIAGNOSIS — I050 Rheumatic mitral stenosis: Secondary | ICD-10-CM | POA: Diagnosis not present

## 2019-10-26 NOTE — Patient Instructions (Addendum)
Medication Instructions:  *If you need a refill on your cardiac medications before your next appointment, please call your pharmacy*  Lab Work: If you have labs (blood work) drawn today and your tests are completely normal, you will receive your results only by: . MyChart Message (if you have MyChart) OR . A paper copy in the mail If you have any lab test that is abnormal or we need to change your treatment, we will call you to review the results.  Testing/Procedures: Your physician has requested that you have an echocardiogram. Echocardiography is a painless test that uses sound waves to create images of your heart. It provides your doctor with information about the size and shape of your heart and how well your heart's chambers and valves are working. This procedure takes approximately one hour. There are no restrictions for this procedure.  Follow-Up: At [facility], you and your health needs are our priority.  As part of our continuing mission to provide you with exceptional heart care, we have created designated Provider Care Teams.  These Care Teams include your primary Cardiologist (physician) and Advanced Practice Providers (APPs -  Physician Assistants and Nurse Practitioners) who all work together to provide you with the care you need, when you need it.  We recommend signing up for the patient portal called "MyChart".  Sign up information is provided on this After Visit Summary.  MyChart is used to connect with patients for Virtual Visits (Telemedicine).  Patients are able to view lab/test results, encounter notes, upcoming appointments, etc.  Non-urgent messages can be sent to your provider as well.   To learn more about what you can do with MyChart, go to [email or web address].    Your next appointment:   1 year(s)  The format for your next appointment:   In Person  Provider:   You may see Dr. Nishan or one of the following Advanced Practice Providers on your designated Care  Team:    Lori Gerhardt, NP  Laura Ingold, NP  Jill McDaniel, NP   

## 2019-11-09 ENCOUNTER — Telehealth (HOSPITAL_COMMUNITY): Payer: Self-pay | Admitting: Radiology

## 2019-11-09 NOTE — Telephone Encounter (Signed)
Patient called and canceled echocardiogram. She does not want to reschedule at this time.

## 2019-11-17 ENCOUNTER — Other Ambulatory Visit (HOSPITAL_COMMUNITY): Payer: 59
